# Patient Record
Sex: Female | Born: 1940 | Race: White | Hispanic: No | Smoking: Former smoker
Health system: Southern US, Community
[De-identification: ages and names within clinical notes are randomized; demographics above are authoritative.]

## PROBLEM LIST (undated history)

## (undated) DIAGNOSIS — E785 Hyperlipidemia, unspecified: Secondary | ICD-10-CM

## (undated) DIAGNOSIS — I10 Essential (primary) hypertension: Secondary | ICD-10-CM

## (undated) DIAGNOSIS — F0390 Unspecified dementia without behavioral disturbance: Secondary | ICD-10-CM

---

## 2009-03-06 ENCOUNTER — Ambulatory Visit: Payer: Self-pay | Admitting: Internal Medicine

## 2009-03-14 ENCOUNTER — Ambulatory Visit: Payer: Self-pay

## 2009-09-28 ENCOUNTER — Ambulatory Visit: Payer: Self-pay | Admitting: Internal Medicine

## 2010-12-04 ENCOUNTER — Ambulatory Visit: Payer: Self-pay | Admitting: Internal Medicine

## 2011-12-11 ENCOUNTER — Ambulatory Visit: Payer: Self-pay | Admitting: Internal Medicine

## 2012-01-10 ENCOUNTER — Encounter: Payer: Self-pay | Admitting: Nurse Practitioner

## 2012-01-10 ENCOUNTER — Encounter: Payer: Self-pay | Admitting: Cardiothoracic Surgery

## 2012-01-13 ENCOUNTER — Ambulatory Visit: Payer: Self-pay | Admitting: Internal Medicine

## 2012-01-29 ENCOUNTER — Encounter: Payer: Self-pay | Admitting: Cardiothoracic Surgery

## 2012-01-29 ENCOUNTER — Encounter: Payer: Self-pay | Admitting: Nurse Practitioner

## 2012-02-17 LAB — CBC
HGB: 13 g/dL (ref 12.0–16.0)
MCH: 29.7 pg (ref 26.0–34.0)
MCHC: 33.4 g/dL (ref 32.0–36.0)
MCV: 89 fL (ref 80–100)
Platelet: 185 10*3/uL (ref 150–440)
RDW: 13.1 % (ref 11.5–14.5)
WBC: 11.8 10*3/uL — ABNORMAL HIGH (ref 3.6–11.0)

## 2012-02-17 LAB — COMPREHENSIVE METABOLIC PANEL
Alkaline Phosphatase: 78 U/L (ref 50–136)
BUN: 15 mg/dL (ref 7–18)
Bilirubin,Total: 0.3 mg/dL (ref 0.2–1.0)
Calcium, Total: 8.6 mg/dL (ref 8.5–10.1)
Chloride: 105 mmol/L (ref 98–107)
Co2: 25 mmol/L (ref 21–32)
EGFR (African American): >60
EGFR (Non-African Amer.): >60
Osmolality: 283 (ref 275–301)
Potassium: 3.7 mmol/L (ref 3.5–5.1)
SGOT(AST): 32 U/L (ref 15–37)
Total Protein: 7 g/dL (ref 6.4–8.2)

## 2012-02-17 LAB — CK TOTAL AND CKMB (NOT AT ARMC)
CK, Total: 106 U/L (ref 21–215)
CK-MB: 0.8 ng/mL (ref 0.5–3.6)

## 2012-02-17 LAB — TROPONIN I: Troponin-I: 0.13 ng/mL — ABNORMAL HIGH

## 2012-02-18 ENCOUNTER — Inpatient Hospital Stay: Payer: Self-pay | Admitting: Internal Medicine

## 2012-02-18 LAB — URINALYSIS, COMPLETE
Bacteria: NONE SEEN
Bilirubin,UR: NEGATIVE
Blood: NEGATIVE
Glucose,UR: NEGATIVE mg/dL (ref 0–75)
Leukocyte Esterase: NEGATIVE
Ph: 5 (ref 4.5–8.0)
Protein: 100
Squamous Epithelial: <1

## 2012-02-19 LAB — CBC WITH DIFFERENTIAL/PLATELET
Basophil #: 0.1 10*3/uL (ref 0.0–0.1)
Eosinophil #: 0.2 10*3/uL (ref 0.0–0.7)
HGB: 11.6 g/dL — ABNORMAL LOW (ref 12.0–16.0)
Lymphocyte #: 2 10*3/uL (ref 1.0–3.6)
Lymphocyte %: 29.4 %
MCHC: 33.5 g/dL (ref 32.0–36.0)
MCV: 89 fL (ref 80–100)
Monocyte #: 0.7 x10 3/mm (ref 0.2–0.9)
Monocyte %: 10.3 %
Neutrophil #: 3.9 10*3/uL (ref 1.4–6.5)
Neutrophil %: 56.8 %
Platelet: 169 10*3/uL (ref 150–440)
RBC: 3.9 10*6/uL (ref 3.80–5.20)
RDW: 12.9 % (ref 11.5–14.5)

## 2012-02-19 LAB — BASIC METABOLIC PANEL
Anion Gap: 6 — ABNORMAL LOW (ref 7–16)
BUN: 15 mg/dL (ref 7–18)
Calcium, Total: 8.6 mg/dL (ref 8.5–10.1)
Chloride: 105 mmol/L (ref 98–107)
Co2: 31 mmol/L (ref 21–32)
Creatinine: 0.79 mg/dL (ref 0.60–1.30)
EGFR (African American): >60
Potassium: 4 mmol/L (ref 3.5–5.1)
Sodium: 142 mmol/L (ref 136–145)

## 2012-02-20 LAB — BASIC METABOLIC PANEL
Anion Gap: 10 (ref 7–16)
BUN: 10 mg/dL (ref 7–18)
Chloride: 105 mmol/L (ref 98–107)
Co2: 28 mmol/L (ref 21–32)
Creatinine: 0.66 mg/dL (ref 0.60–1.30)
EGFR (African American): >60
Glucose: 79 mg/dL (ref 65–99)
Osmolality: 283 (ref 275–301)
Potassium: 3.9 mmol/L (ref 3.5–5.1)
Sodium: 143 mmol/L (ref 136–145)

## 2012-02-20 LAB — CBC WITH DIFFERENTIAL/PLATELET
Basophil #: 0 10*3/uL (ref 0.0–0.1)
Eosinophil #: 0.2 10*3/uL (ref 0.0–0.7)
Eosinophil %: 3.3 %
HCT: 35.2 % (ref 35.0–47.0)
Lymphocyte %: 25.3 %
MCH: 29.8 pg (ref 26.0–34.0)
MCHC: 33.8 g/dL (ref 32.0–36.0)
MCV: 88 fL (ref 80–100)
Monocyte #: 0.7 x10 3/mm (ref 0.2–0.9)
Monocyte %: 9.3 %
Neutrophil #: 4.5 10*3/uL (ref 1.4–6.5)
Neutrophil %: 61.5 %
Platelet: 191 10*3/uL (ref 150–440)
RBC: 4 10*6/uL (ref 3.80–5.20)
RDW: 12.8 % (ref 11.5–14.5)
WBC: 7.3 10*3/uL (ref 3.6–11.0)

## 2012-02-29 ENCOUNTER — Encounter: Payer: Self-pay | Admitting: Nurse Practitioner

## 2012-02-29 ENCOUNTER — Encounter: Payer: Self-pay | Admitting: Cardiothoracic Surgery

## 2012-03-30 ENCOUNTER — Encounter: Payer: Self-pay | Admitting: Cardiothoracic Surgery

## 2012-03-30 ENCOUNTER — Encounter: Payer: Self-pay | Admitting: Nurse Practitioner

## 2012-04-30 ENCOUNTER — Encounter: Payer: Self-pay | Admitting: Nurse Practitioner

## 2012-04-30 ENCOUNTER — Encounter: Payer: Self-pay | Admitting: Cardiothoracic Surgery

## 2012-05-31 ENCOUNTER — Encounter: Payer: Self-pay | Admitting: Cardiothoracic Surgery

## 2012-05-31 ENCOUNTER — Encounter: Payer: Self-pay | Admitting: Nurse Practitioner

## 2012-06-30 ENCOUNTER — Encounter: Payer: Self-pay | Admitting: Cardiothoracic Surgery

## 2012-06-30 ENCOUNTER — Encounter: Payer: Self-pay | Admitting: Nurse Practitioner

## 2012-07-31 ENCOUNTER — Encounter: Payer: Self-pay | Admitting: Cardiothoracic Surgery

## 2012-07-31 ENCOUNTER — Encounter: Payer: Self-pay | Admitting: Nurse Practitioner

## 2012-08-30 ENCOUNTER — Encounter: Payer: Self-pay | Admitting: Nurse Practitioner

## 2012-08-30 ENCOUNTER — Encounter: Payer: Self-pay | Admitting: Cardiothoracic Surgery

## 2012-10-05 ENCOUNTER — Encounter: Payer: Self-pay | Admitting: Nurse Practitioner

## 2012-10-05 ENCOUNTER — Encounter: Payer: Self-pay | Admitting: Cardiothoracic Surgery

## 2012-10-31 ENCOUNTER — Encounter: Payer: Self-pay | Admitting: Nurse Practitioner

## 2012-10-31 ENCOUNTER — Encounter: Payer: Self-pay | Admitting: Cardiothoracic Surgery

## 2012-11-28 ENCOUNTER — Encounter: Payer: Self-pay | Admitting: Nurse Practitioner

## 2012-11-28 ENCOUNTER — Encounter: Payer: Self-pay | Admitting: Cardiothoracic Surgery

## 2012-12-29 ENCOUNTER — Encounter: Payer: Self-pay | Admitting: Cardiothoracic Surgery

## 2012-12-29 ENCOUNTER — Encounter: Payer: Self-pay | Admitting: Nurse Practitioner

## 2013-01-28 ENCOUNTER — Encounter: Payer: Self-pay | Admitting: Cardiothoracic Surgery

## 2013-01-28 ENCOUNTER — Encounter: Payer: Self-pay | Admitting: Nurse Practitioner

## 2013-02-28 ENCOUNTER — Encounter: Payer: Self-pay | Admitting: Nurse Practitioner

## 2013-02-28 ENCOUNTER — Encounter: Payer: Self-pay | Admitting: Cardiothoracic Surgery

## 2015-01-22 NOTE — H&P (Signed)
PATIENT NAME:  Karina [name], Karina [name] MR#:  [id number] DATE OF BIRTH:  [birth year]  DATE OF ADMISSION:  02/18/2012  PRIMARY CARE PHYSICIAN: Dr. Marcello Fennel  CHIEF COMPLAINT:  Syncope and finding of elevated troponin.   HISTORY OF PRESENT ILLNESS: Mrs. Madera is a 74 year old Caucasian female with history of dementia and depression. She is widowed. Lives at home, cared for by her brother. She is total care. She spends her time in a wheelchair, but with assistance she is able to stand up and hold her weight when her brother takes her to the bathroom. The brother reports that today while she was on the commode she rolled her eyes backward and then she lay on the floor. She did not talk and did not respond to him. He dialed 911. In the interim while he was on the telephone she was mumbling a few words. He thinks that she had only brief loss of consciousness. The patient was brought here to the hospital for evaluation and the findings here are showing some EKG changes and also elevated troponin. The patient was admitted for evaluation of syncope in association with possible non-ST elevation acute myocardial infarction.   REVIEW OF SYSTEMS: 10-point system review is not obtainable due to the patient's advanced dementia.   PAST MEDICAL HISTORY:  1. Dementia. 2. Depression. 3. Mild hypercholesterolemia.  4. The patient currently is being [facility] by [facility] for bedsores located in the posterior heel area on both feet.  FAMILY HISTORY: Her father died from metastatic kidney cancer. Her mother suffered several strokes before she died.   SOCIAL HISTORY: She is widowed. Her husband died a few years ago from complications of dementia. She lives at home with her brother. The patient is confined to a wheelchair.   SOCIAL HABITS: Nonsmoker. No history of alcohol abuse.   ADMISSION MEDICATIONS:  1. Donepezil or Aricept 10 mg a day.  2. Zyprexa 10 mg at night. 3. Mirtazapine 30 mg at night.  4. She is also on  pravastatin 10 mg, but the brother said he gives her this medication only once a week.   ALLERGIES: Penicillin causes skin rash.    PHYSICAL EXAMINATION:  VITAL SIGNS: Blood pressure 107/71, respiratory rate 16, pulse 97, temperature 96.5, oxygen saturation 96%.   GENERAL APPEARANCE: Elderly pleasant female lying in bed in no acute distress.   HEAD: No pallor. No icterus. No cyanosis. Hearing was normal. She will respond to the questions but inappropriate answers. Nasal mucosa, lips, and tongue were normal. Eye examination revealed normal eyelids and conjunctivae. Pupils about 5 mm, equal and reactive to light.   NECK: Supple. Trachea at midline. No thyromegaly. No cervical lymphadenopathy. No masses.   HEART: Normal S1, S2. No S3, S4. No murmur. No gallop. No carotid bruits.   RESPIRATORY: Normal breathing pattern without use of accessory muscles. No rales. No wheezing.   ABDOMEN: Soft without tenderness. No hepatosplenomegaly. No masses. No hernias.   SKIN: She has bilateral heel bedsores. No discharge. No skin rash.   MUSCULOSKELETAL: No joint swelling. No clubbing.   NEUROLOGIC: Cranial nerves II through XII were intact. No focal motor deficit but generally she is feeble and weak.   PSYCHIATRY: The patient smiles and communicates but answers are consistent with her dementia.   LABORATORY, DIAGNOSTIC, AND RADIOLOGICAL DATA:  CT scan of the head showed no evidence of intracranial hemorrhage or mass or acute abnormalities. Her chest x-ray showed heart size is upper normal or slightly enlarged. No effusion. No  consolidation. EKG showed sinus tachycardia at a rate of 104 per minute. Tendency towards low voltage. Incomplete right bundle branch block. These are new changes compared to an old EKG done in June 2010. Serum glucose 114, BUN 15, creatinine 0.7, sodium 141, potassium 3.7. Liver function tests were normal except for slightly low albumin at 3.1. CPK 106. Troponin is elevated at 0.13.  CBC showed white count of 11,800, hemoglobin 13, hematocrit 38, platelet count 185. Urinalysis: Cloudy urine, five RBCs, four white blood cells.   ASSESSMENT:  1. Syncope. 2. Non-ST elevation acute myocardial infarction.  3. Advanced dementia, bedridden and requires total care. 4. Possible underlying urinary tract infection. 5. Bilateral heel bedsores.   PLAN: We will admit the patient to telemetry, follow up on cardiac enzymes. Start aspirin once a day. Small dose of beta blocker using Coreg 3.125 mg twice a day. I will resume her statin, Pravachol using 20 mg a day. Consult cardiology. The patient is FULL CODE,  but I think her treatment should be conservative given her advanced dementia. I will leave this decision for the cardiologist of how far he wants to go. I did not order echocardiogram for similar reasons. I placed her on Lovenox. I will send urine for culture and sensitivity. Wound care consult and also use heel protectors. I spoke with her brother regarding the patient's LIVING WILL. He states that she does have a LIVING WILL. She is a FULL CODE and he has the power of attorney. Her brother is Ileana LaddWalter R. Petree.   TIME SPENT EVALUATING THIS PATIENT: More than 55 minutes.    ____________________________ Carney CornersAmir M. Rudene Rearwish, MD amd:bjt D: 02/18/2012 01:37:38 ET T: 02/18/2012 07:51:27 ET JOB#: [id number]  cc: Carney CornersAmir M. Rudene Rearwish, MD, <Dictator> Barbette ReichmannVishwanath Hande, MD Karolee OhsAMIR Dala DockM Daron Stutz MD ELECTRONICALLY SIGNED 02/19/2012 7:04

## 2015-01-22 NOTE — Discharge Summary (Signed)
PATIENT NAME:  Karina [name], Karina [name] MR#:  [id number] DATE OF BIRTH:  05-14-1941  DATE OF ADMISSION:  02/18/2012 DATE OF DISCHARGE:  02/20/2012  DIAGNOSES AT TIME OF DISCHARGE: 1. Syncope.  2. Elevated troponin.  3. Advanced dementia.  4. Bilateral heel bed sores.   CHIEF COMPLAINT: Syncope.   HISTORY OF PRESENT ILLNESS: Karina [name] is [name] 74 year old female with [name] history of dementia, depression, who lives at home cared for by her brother and according to brother she was on the commode when she rolled her eyes backwards and she laid on the floor. She did not respond to him for [name] few seconds and then subsequently was more awake and mumbled [name] few words. Reportedly patient had only [name] brief loss of consciousness. Patient was noted on arrival to have an elevated troponin. Was admitted for that reason.   PAST MEDICAL HISTORY:  1. Dementia. 2. Depression. 3. Mild hyperlipidemia.  4. She also has bed sores located in the he posterior aspect of the heel on both feet and is currently being seen in the wound care center.   CURRENT MEDICATIONS:  1. Donepezil.  2. Zyprexa. 3. Mirtazapine. 4. Pravastatin.   PHYSICAL EXAMINATION: VITAL SIGNS: Blood pressure 107/71, respirations 16, pulse 97, temperature 96.5, oxygen saturation 96. GENERAL: She is an elderly female, not in distress. HEENT: Normocephalic, atraumatic. NECK: Supple. HEART: S1, S2. LUNGS: Clear to auscultation. ABDOMEN: Soft, nontender. EXTREMITIES: No edema.   HOSPITAL COURSE: Patient was seen in consultation by cardiologist, Dr. Lady GaryFath, who felt that she had mild elevation of serum troponin of 0.64. She was [name] difficult historian. Electrocardiogram did not show evidence of ischemia and it was unclear whether she had true myocardial event and he did not feel that she was [name] candidate for further diagnostic work-up for ischemia considering her comorbid condition. Patient was initially admitted to Critical Care Unit and subsequently transferred to  telemetry floor. She was also seen by wound care consult and was noted to have [name] stage II heel pressure ulcer on the right side and [name] stage III distal heel pressure on the left side. Wounds were clean and dressing was done with calcium alginate applied to the wound base. The wound is covered with hydrocolloid dressings and was recommended dressing to be changed q.3-5 days.  Patient was stable during her stay in the hospital. CT head scan did not show any intracranial abnormalities. There were chronic findings. [name] carotid duplex ultrasound did not show evidence of hemodynamically significant stenosis. Chest x-ray did not show any acute abnormalities. Patient was discharged in stable condition on the following medications.   DISCHARGE MEDICATIONS:  1. Carvedilol 3.125 mg p.o. b.i.d.  2. Mirtazapine 30 mg p.o. at bedtime. 3. Aricept 10 mg p.o. daily.  4. Lovastatin 20 mg [name] day.  5. Aspirin 325 mg [name] day.  DISCHARGE INSTRUCTIONS: Has been advised nursing, physical and occupational therapy and wound care as per wound care team. Patient will be followed up with me, Dr. Marcello FennelHande, in 1 to 2 weeks' time.  ____________________________ Barbette ReichmannVishwanath [place] Willow, MD vh:cms D: 02/28/2012 17:07:20 ET T: 02/29/2012 12:43:29 ET JOB#: [id number]  cc: Barbette ReichmannVishwanath Ashur Glatfelter, MD, <Dictator> Barbette ReichmannVISHWANATH Kaydon Husby MD ELECTRONICALLY SIGNED 03/12/2012 13:00

## 2015-01-22 NOTE — Consult Note (Signed)
    General Aspect 74 year old female with depression, dementia who was admitted with chest pain and has a mild serum troponin elevation of 0.64.  She is a very difficult historian due to her underlying comorbid conditions.  Her electrocardiogram did not show significant ischemia.  She has improved and is hemodynamically stable at present.   Physical Exam:   GEN disheveled    HEENT PERRL    NECK supple  No masses    RESP no use of accessory muscles    CARD Regular rate and rhythm  Murmur    Murmur Systolic    Systolic Murmur axilla    ABD denies tenderness  normal BS    LYMPH negative neck    EXTR negative cyanosis/clubbing, negative edema    SKIN normal to palpation    NEURO motor/sensory function intact    PSYCH poor insight   Review of Systems:   Respiratory: Short of breath    Cardiovascular: Tightness    Review of Systems: All other systems were reviewed and found to be negative    ROS very limited historian     depression:    dementia:   EKG:   EKG NSR    Abnormal NSSTTW changes    Penicillin: Rash    Impression 74 year old female with depression and dementia who is admitted with shortness of breath and chest discomfort.  She has a mild serum: Elevation which may suggest a non-ST elevation myocardial infarction.  She is currently pain-free but is a very difficult historian.  She denies syncope or presyncope.  She denies any knowledge of prior cardiac history.  She apparently is total care do to her underlying comorbid conditions.  Echocardiogram is pending    Plan 1.  Continue with current medications 2.  Continue to rule out for myocardial infarction 3.  Await echocardiogram 4.  Does not appear to be a candidate for invasive cardiac evaluation or noninvasive ischemic workup given comorbid condition.  Would continue to treat medically and follow for further symptoms.    5.  Transfer to telemetry   Electronic Signatures: Dalia HeadingFath, Kenneth A (MD)  (Signed  21-May-13 14:38)  Authored: General Aspect/Present Illness, History and Physical Exam, Review of System, Past Medical History, EKG , Allergies, Impression/Plan   Last Updated: 21-May-13 14:38 by Dalia HeadingFath, Kenneth A (MD)

## 2015-09-13 ENCOUNTER — Observation Stay
Admission: EM | Admit: 2015-09-13 | Discharge: 2015-09-15 | Disposition: A | Discharge status: Home / Self Care | Payer: Medicare Other | Attending: Internal Medicine | Admitting: Internal Medicine

## 2015-09-13 ENCOUNTER — Encounter: Payer: Self-pay | Admitting: Emergency Medicine

## 2015-09-13 ENCOUNTER — Emergency Department: Payer: Medicare Other

## 2015-09-13 DIAGNOSIS — F0390 Unspecified dementia without behavioral disturbance: Secondary | ICD-10-CM | POA: Insufficient documentation

## 2015-09-13 DIAGNOSIS — Z880 Allergy status to penicillin: Secondary | ICD-10-CM | POA: Insufficient documentation

## 2015-09-13 DIAGNOSIS — R4182 Altered mental status, unspecified: Secondary | ICD-10-CM | POA: Diagnosis not present

## 2015-09-13 DIAGNOSIS — Z818 Family history of other mental and behavioral disorders: Secondary | ICD-10-CM | POA: Insufficient documentation

## 2015-09-13 DIAGNOSIS — L8990 Pressure ulcer of unspecified site, unspecified stage: Secondary | ICD-10-CM | POA: Insufficient documentation

## 2015-09-13 DIAGNOSIS — Z823 Family history of stroke: Secondary | ICD-10-CM | POA: Insufficient documentation

## 2015-09-13 DIAGNOSIS — L89301 Pressure ulcer of unspecified buttock, stage 1: Secondary | ICD-10-CM | POA: Diagnosis not present

## 2015-09-13 DIAGNOSIS — N390 Urinary tract infection, site not specified: Secondary | ICD-10-CM | POA: Insufficient documentation

## 2015-09-13 DIAGNOSIS — I119 Hypertensive heart disease without heart failure: Secondary | ICD-10-CM | POA: Insufficient documentation

## 2015-09-13 DIAGNOSIS — F809 Developmental disorder of speech and language, unspecified: Secondary | ICD-10-CM | POA: Diagnosis not present

## 2015-09-13 DIAGNOSIS — E785 Hyperlipidemia, unspecified: Secondary | ICD-10-CM | POA: Diagnosis not present

## 2015-09-13 DIAGNOSIS — Z87891 Personal history of nicotine dependence: Secondary | ICD-10-CM | POA: Insufficient documentation

## 2015-09-13 DIAGNOSIS — Z79899 Other long term (current) drug therapy: Secondary | ICD-10-CM | POA: Insufficient documentation

## 2015-09-13 DIAGNOSIS — Z8249 Family history of ischemic heart disease and other diseases of the circulatory system: Secondary | ICD-10-CM | POA: Insufficient documentation

## 2015-09-13 DIAGNOSIS — Z7401 Bed confinement status: Secondary | ICD-10-CM | POA: Insufficient documentation

## 2015-09-13 DIAGNOSIS — I517 Cardiomegaly: Secondary | ICD-10-CM | POA: Diagnosis not present

## 2015-09-13 HISTORY — DX: Hyperlipidemia, unspecified: E78.5

## 2015-09-13 HISTORY — DX: Essential (primary) hypertension: I10

## 2015-09-13 HISTORY — DX: Unspecified dementia, unspecified severity, without behavioral disturbance, psychotic disturbance, mood disturbance, and anxiety: F03.90

## 2015-09-13 LAB — COMPREHENSIVE METABOLIC PANEL
ALBUMIN: 3.3 g/dL — AB (ref 3.5–5.0)
ALK PHOS: 73 U/L (ref 38–126)
ALT: 21 U/L (ref 14–54)
ANION GAP: 6 (ref 5–15)
AST: 20 U/L (ref 15–41)
BILIRUBIN TOTAL: 0.9 mg/dL (ref 0.3–1.2)
BUN: 12 mg/dL (ref 6–20)
CO2: 25 mmol/L (ref 22–32)
Calcium: 9.1 mg/dL (ref 8.9–10.3)
Chloride: 111 mmol/L (ref 101–111)
Creatinine, Ser: 0.5 mg/dL (ref 0.44–1.00)
GFR calc Af Amer: >60 mL/min (ref >60)
GFR calc non Af Amer: >60 mL/min (ref >60)
GLUCOSE: 101 mg/dL — AB (ref 65–99)
Potassium: 3.8 mmol/L (ref 3.5–5.1)
Sodium: 142 mmol/L (ref 135–145)
TOTAL PROTEIN: 7 g/dL (ref 6.5–8.1)

## 2015-09-13 LAB — URINALYSIS COMPLETE WITH MICROSCOPIC (ARMC ONLY)
BILIRUBIN URINE: NEGATIVE
GLUCOSE, UA: NEGATIVE mg/dL
KETONES UR: NEGATIVE mg/dL
NITRITE: POSITIVE — AB
Protein, ur: NEGATIVE mg/dL
SPECIFIC GRAVITY, URINE: 1.014 (ref 1.005–1.030)
pH: 6 (ref 5.0–8.0)

## 2015-09-13 LAB — CBC
HCT: 41 % (ref 35.0–47.0)
Hemoglobin: 13.7 g/dL (ref 12.0–16.0)
MCH: 29.6 pg (ref 26.0–34.0)
MCHC: 33.4 g/dL (ref 32.0–36.0)
MCV: 88.7 fL (ref 80.0–100.0)
PLATELETS: 281 10*3/uL (ref 150–440)
RBC: 4.62 MIL/uL (ref 3.80–5.20)
RDW: 12.7 % (ref 11.5–14.5)
WBC: 11 10*3/uL (ref 3.6–11.0)

## 2015-09-13 LAB — CBC WITH DIFFERENTIAL/PLATELET
Basophils Absolute: 0.2 10*3/uL — ABNORMAL HIGH (ref 0–0.1)
Basophils Relative: 1 %
Eosinophils Absolute: 0.4 10*3/uL (ref 0–0.7)
Eosinophils Relative: 3 %
HEMATOCRIT: 43.9 % (ref 35.0–47.0)
HEMOGLOBIN: 14.6 g/dL (ref 12.0–16.0)
LYMPHS ABS: 2 10*3/uL (ref 1.0–3.6)
Lymphocytes Relative: 16 %
MCH: 29.8 pg (ref 26.0–34.0)
MCHC: 33.3 g/dL (ref 32.0–36.0)
MCV: 89.5 fL (ref 80.0–100.0)
MONOS PCT: 7 %
Monocytes Absolute: 0.8 10*3/uL (ref 0.2–0.9)
NEUTROS PCT: 73 %
Neutro Abs: 8.8 10*3/uL — ABNORMAL HIGH (ref 1.4–6.5)
Platelets: 269 10*3/uL (ref 150–440)
RBC: 4.9 MIL/uL (ref 3.80–5.20)
RDW: 13.1 % (ref 11.5–14.5)
WBC: 12.2 10*3/uL — AB (ref 3.6–11.0)

## 2015-09-13 LAB — CREATININE, SERUM: CREATININE: 0.48 mg/dL (ref 0.44–1.00)

## 2015-09-13 LAB — TROPONIN I

## 2015-09-13 MED ORDER — LEVOFLOXACIN IN D5W 750 MG/150ML IV SOLN
750.0000 mg | Freq: Once | INTRAVENOUS | Status: AC
  Administered 2015-09-13: 750 mg via INTRAVENOUS
  Filled 2015-09-13: qty 150

## 2015-09-13 MED ORDER — VITAMIN B-12 1000 MCG PO TABS
1000.0000 ug | ORAL_TABLET | Freq: Every day | ORAL | Status: DC
  Administered 2015-09-13 – 2015-09-15 (×3): 1000 ug via ORAL
  Filled 2015-09-13 (×3): qty 1

## 2015-09-13 MED ORDER — OLANZAPINE 10 MG PO TABS
10.0000 mg | ORAL_TABLET | Freq: Every day | ORAL | Status: DC
  Administered 2015-09-13 – 2015-09-14 (×2): 10 mg via ORAL
  Filled 2015-09-13 (×2): qty 1

## 2015-09-13 MED ORDER — SODIUM CHLORIDE 0.9 % IV SOLN
INTRAVENOUS | Status: DC
  Administered 2015-09-13 – 2015-09-14 (×2): via INTRAVENOUS

## 2015-09-13 MED ORDER — ONDANSETRON HCL 4 MG/2ML IJ SOLN: 4.0000 mg | Freq: Four times a day (QID) | INTRAMUSCULAR | Status: DC | PRN

## 2015-09-13 MED ORDER — SODIUM CHLORIDE 0.9 % IV BOLUS (SEPSIS)
500.0000 mL | Freq: Once | INTRAVENOUS | Status: AC
  Administered 2015-09-13: 500 mL via INTRAVENOUS

## 2015-09-13 MED ORDER — PRAVASTATIN SODIUM 20 MG PO TABS
20.0000 mg | ORAL_TABLET | Freq: Every day | ORAL | Status: DC
  Administered 2015-09-13 – 2015-09-14 (×2): 20 mg via ORAL
  Filled 2015-09-13 (×2): qty 1

## 2015-09-13 MED ORDER — ACETAMINOPHEN 650 MG RE SUPP: 650.0000 mg | Freq: Four times a day (QID) | RECTAL | Status: DC | PRN

## 2015-09-13 MED ORDER — ACETAMINOPHEN 325 MG PO TABS: 650.0000 mg | ORAL_TABLET | Freq: Four times a day (QID) | ORAL | Status: DC | PRN

## 2015-09-13 MED ORDER — ENOXAPARIN SODIUM 40 MG/0.4ML ~~LOC~~ SOLN
40.0000 mg | SUBCUTANEOUS | Status: DC
  Administered 2015-09-13 – 2015-09-14 (×2): 40 mg via SUBCUTANEOUS
  Filled 2015-09-13 (×2): qty 0.4

## 2015-09-13 MED ORDER — DONEPEZIL HCL 5 MG PO TABS
10.0000 mg | ORAL_TABLET | Freq: Every day | ORAL | Status: DC
  Administered 2015-09-13 – 2015-09-14 (×2): 10 mg via ORAL
  Filled 2015-09-13 (×2): qty 2

## 2015-09-13 MED ORDER — ONDANSETRON HCL 4 MG PO TABS: 4.0000 mg | ORAL_TABLET | Freq: Four times a day (QID) | ORAL | Status: DC | PRN

## 2015-09-13 MED ORDER — CARVEDILOL 3.125 MG PO TABS
3.1250 mg | ORAL_TABLET | Freq: Every day | ORAL | Status: DC
  Administered 2015-09-13 – 2015-09-15 (×3): 3.125 mg via ORAL
  Filled 2015-09-13 (×3): qty 1

## 2015-09-13 MED ORDER — LEVOFLOXACIN IN D5W 250 MG/50ML IV SOLN
250.0000 mg | INTRAVENOUS | Status: DC
  Filled 2015-09-13: qty 50

## 2015-09-13 NOTE — ED Provider Notes (Signed)
[facility] Emergency Department Provider Note  ____________________________________________  Time seen: Approximately 12:05 PM  I have reviewed the triage vital signs and the nursing notes.   HISTORY  Chief Complaint Altered Mental Status  Review of systems is limited due to the patient's dementia. All information is obtained from her brother/caregiver at bedside.  HPI Karina [name] is a 74 y.o. female dementia and hyperlipidemia who presents for evaluation of lethargy, increased confusion, inability to speak today, first noted when she was awakened at 10 AM, constant since onset, currently severe, no modifying factors. He reports that he awoke the patient at approximate 4:30 AM to rotate her in bed. When he went back into check on her at 10:30 AM and awakened her for breakfast, she was refusing to speak, was not following commands. She has had no fevers, chills, vomiting or diarrhea. No cough, no complaint of pain, no recent falls.   Past Medical History  Diagnosis Date  . Dementia   . Hypertension     Patient Active Problem List   Diagnosis Date Noted  . Altered mental status 09/13/2015    History reviewed. No pertinent past surgical history.  Current Outpatient Rx  Name  Route  Sig  Dispense  Refill  . carvedilol (COREG) 3.125 MG tablet   Oral   Take 3.125 mg by mouth daily.         Marland Kitchen. donepezil (ARICEPT) 10 MG tablet   Oral   Take 10 mg by mouth at bedtime.         . lovastatin (MEVACOR) 20 MG tablet   Oral   Take 20 mg by mouth at bedtime.         Marland Kitchen. OLANZapine (ZYPREXA) 10 MG tablet   Oral   Take 10 mg by mouth at bedtime.         . vitamin B-12 (CYANOCOBALAMIN) 1000 MCG tablet   Oral   Take 1,000 mcg by mouth daily.           Allergies Penicillins  Family History  Problem Relation Age of Onset  . Stroke Mother   . Coronary artery disease Father   . Depression Other     Social History Social History  Substance  Use Topics  . Smoking status: Former Games developermoker  . Smokeless tobacco: None  . Alcohol Use: No    Review of Systems Constitutional: No fever/chills Respiratory: No perceived shortness of breath. Gastrointestinal:  No vomiting.  No diarrhea.  No constipation. Genitourinary: Negative for hematuria or foul-smelling urine.   Review of systems is limited due to the patient's dementia. All information is obtained from her brother/caregiver at bedside. ____________________________________________   PHYSICAL EXAM:  VITAL SIGNS: ED Triage Vitals  Enc Vitals Group     BP 09/13/15 1200 157/74 mmHg     Pulse Rate 09/13/15 1200 85     Resp --      Temp 09/13/15 1200 99.4 F (37.4 C)     Temp Source 09/13/15 1200 Rectal     SpO2 09/13/15 1200 98 %     Weight 09/13/15 1200 153 lb 7 oz (69.6 kg)     Height 09/13/15 1200 5\' 6"  (1.676 m)     Head Cir --      Peak Flow --      Pain Score --      Pain Loc --      Pain Edu? --      Excl. in GC? --  Constitutional: Alert but is not oriented to self, place or year. She mutters incoherent phrases, does not follow commands. Eyes: Conjunctivae are normal. PERRL. EOMI. Head: Atraumatic. Nose: No congestion/rhinnorhea. Mouth/Throat: Mucous membranes are dry.  Oropharynx non-erythematous. Neck: No stridor.  Ears supple without meningismus. Cardiovascular: Normal rate, regular rhythm. Grossly normal heart sounds.  Good peripheral circulation. Respiratory: Normal respiratory effort.  No retractions. Lungs CTAB. Gastrointestinal: Soft and nontender. No distention. No CVA tenderness. Genitourinary: deferred Musculoskeletal: No lower extremity tenderness nor edema.  No joint effusions. Neurologic:  Patient with limited verbal interaction. She does not follow commands. She appears to move all extremities spontaneously and equally but does not cooperate with formal neurological testing. Skin:  Skin is warm, dry and intact. No rash  noted.   ____________________________________________   LABS (all labs ordered are listed, but only abnormal results are displayed)  Labs Reviewed  CBC WITH DIFFERENTIAL/PLATELET - Abnormal; Notable for the following:    WBC 12.2 (*)    Neutro Abs 8.8 (*)    Basophils Absolute 0.2 (*)    All other components within normal limits  COMPREHENSIVE METABOLIC PANEL - Abnormal; Notable for the following:    Glucose, Bld 101 (*)    Albumin 3.3 (*)    All other components within normal limits  URINALYSIS COMPLETEWITH MICROSCOPIC ([facility] ONLY) - Abnormal; Notable for the following:    Color, Urine YELLOW (*)    APPearance HAZY (*)    Hgb urine dipstick 2+ (*)    Nitrite POSITIVE (*)    Leukocytes, UA 2+ (*)    Bacteria, UA FEW (*)    Squamous Epithelial / LPF 0-5 (*)    All other components within normal limits  CULTURE, BLOOD (ROUTINE X 2)  CULTURE, BLOOD (ROUTINE X 2)  URINE CULTURE  TROPONIN I   ____________________________________________  EKG  ED ECG REPORT I, Gayla Doss, the attending physician, personally viewed and interpreted this ECG.   Date: 09/13/2015  EKG Time: 12:19  Rate: 83  Rhythm: normal sinus rhythm  Axis: normal  Intervals:none  ST&T Change: No acute ST elevation.  ____________________________________________  RADIOLOGY  CXR IMPRESSION: Cardiomegaly and bronchitic change without acute cardiopulmonary disease.  CT head  IMPRESSION: Mild diffuse cortical atrophy. Mild chronic ischemic white matter disease. No acute intracranial abnormality seen. ____________________________________________   PROCEDURES  Procedure(s) performed: None  Critical Care performed: No  ____________________________________________   INITIAL IMPRESSION / ASSESSMENT AND PLAN / ED COURSE  Pertinent labs & imaging results that were available during my care of the patient were reviewed by me and considered in my medical decision making (see chart for  details).  Karina [name] is a 74 y.o. female dementia and hyperlipidemia who presents for evaluation of lethargy, increased confusion, inability to speak today. On exam, she is awake but she is not oriented, does not follow commands but she does seem to move her extremities equally. Labs reviewed. Normal CMP. CBC notable for leukocytosis. Negative troponin. Urinalysis is concerning for urinary tract infection which I suspect is the cause of her change in mental status today. We'll give IV fluids, Levaquin given penicillin allergy with reaction unknown. CT head and chest x-ray negative. Case discussed with hospitalist, Dr. Cherlynn Kaiser, for admission at 2:30 PM. ____________________________________________   FINAL CLINICAL IMPRESSION(S) / ED DIAGNOSES  Final diagnoses:  Altered mental status, unspecified altered mental status type  UTI (lower urinary tract infection)      Gayla Doss, MD 09/13/15 1500

## 2015-09-13 NOTE — ED Notes (Signed)
Ems from home for change in mental status. Pt has dementia but has not been same as usual. Ems noted a strong urine smell at home.

## 2015-09-13 NOTE — Progress Notes (Signed)
ANTIBIOTIC CONSULT NOTE - INITIAL  Pharmacy Consult for Levaquin Indication: UTI  Allergies  Allergen Reactions  . Penicillins Other (See Comments)    Reaction:  Unknown     Patient Measurements: Height: 5\' 6"  (167.6 cm) Weight: 153 lb 7 oz (69.6 kg) IBW/kg (Calculated) : 59.3 Adjusted Body Weight:   Vital Signs: Temp: 99.4 F (37.4 C) (12/14 1200) Temp Source: Rectal (12/14 1200) BP: 146/95 mmHg (12/14 1545) Pulse Rate: 71 (12/14 1545) Intake/Output from previous day:   Intake/Output from this shift:    Labs:  Recent Labs  09/13/15 1214  WBC 12.2*  HGB 14.6  PLT 269  CREATININE 0.50   Estimated Creatinine Clearance: 56 mL/min (by C-G formula based on Cr of 0.5).   Microbiology: No results found for this or any previous visit (from the past 720 hour(s)).  Medical History: Past Medical History  Diagnosis Date  . Dementia   . Hypertension   . Hyperlipidemia     Medications:  Scheduled:   Anti-infectives    Start     Dose/Rate Route Frequency Ordered Stop   09/13/15 1430  levofloxacin (LEVAQUIN) IVPB 750 mg     750 mg 100 mL/hr over 90 Minutes Intravenous  Once 09/13/15 1419       Assessment: 74 yo female with UTI (UA 2+ LE, + nitrite, 6-30 WBC). Per Brother, patient has been coughing with meals and swallowing. CXR negative. SLP eval ordered Hx dementia. PCN allergy.  Plan:  Patient received Levaquin 750mg  IV x 1 in ER. Will continue with Levaquin 250mg  IV q24h for UTI dosing.  Bari MantisKristin Faustina Gebert PharmD Clinical Pharmacist 09/13/2015 4:54 PM

## 2015-09-13 NOTE — H&P (Signed)
[facility] Physicians - [place] at [facility]   PATIENT NAME: Karina [name]    MR#:  [id number]  DATE OF BIRTH:  01/16/1941  DATE OF ADMISSION:  09/13/2015  PRIMARY CARE PHYSICIAN: Barbette Reichmann, MD   REQUESTING/REFERRING PHYSICIAN: Dr. Chari Manning  CHIEF COMPLAINT:   Chief Complaint  Patient presents with  . Altered Mental Status    HISTORY OF PRESENT ILLNESS:  Karina [name]  is a 74 y.o. female with a known history of dementia, hypertension, hyperlipidemia who presents to the hospital brought in by her brother due to altered mental status. Herself has baseline dementia and therefore most of the history obtained from the brother at bedside. As per the brother he says that the patient was more lethargic and noncommunicative today. She is normally bed bound and he helps turn her every few hours and usually she is arousable but today she was unresponsive. He also noted that her body felt a bit warm and therefore he called EMS this morning with brought her to the emergency room. Patient was noted to have urinary tract infection and also noted to have a mild leukocytosis. Hospitalist services were contacted further treatment and evaluation.  PAST MEDICAL HISTORY:   Past Medical History  Diagnosis Date  . Dementia   . Hypertension   . Hyperlipidemia     PAST SURGICAL HISTORY:  History reviewed. No pertinent past surgical history.  SOCIAL HISTORY:   Social History  Substance Use Topics  . Smoking status: Former Games developer  . Smokeless tobacco: Not on file  . Alcohol Use: No    FAMILY HISTORY:   Family History  Problem Relation Age of Onset  . Stroke Mother   . Coronary artery disease Father   . Depression Other     DRUG ALLERGIES:   Allergies  Allergen Reactions  . Penicillins Other (See Comments)    Reaction:  Unknown     REVIEW OF SYSTEMS:   Review of Systems  Unable to perform ROS: dementia    MEDICATIONS AT HOME:   Prior to Admission  medications   Medication Sig Start Date End Date Taking? Authorizing Provider  carvedilol (COREG) 3.125 MG tablet Take 3.125 mg by mouth daily.   Yes Historical Provider, MD  donepezil (ARICEPT) 10 MG tablet Take 10 mg by mouth at bedtime.   Yes Historical Provider, MD  lovastatin (MEVACOR) 20 MG tablet Take 20 mg by mouth at bedtime.   Yes Historical Provider, MD  OLANZapine (ZYPREXA) 10 MG tablet Take 10 mg by mouth at bedtime.   Yes Historical Provider, MD  vitamin B-12 (CYANOCOBALAMIN) 1000 MCG tablet Take 1,000 mcg by mouth daily.   Yes Historical Provider, MD      VITAL SIGNS:  Blood pressure 157/74, pulse 85, temperature 99.4 F (37.4 C), temperature source Rectal, height  (1.676 m), weight 69.6 kg (153 lb 7 oz), SpO2 98 %.  PHYSICAL EXAMINATION:  Physical Exam  GENERAL:  74 y.o.-year-old patient lying in the bed with no acute distress.  EYES: Pupils equal, round, reactive to light and accommodation. No scleral icterus. Extraocular muscles intact.  HEENT: Head atraumatic, normocephalic. Oropharynx and nasopharynx clear. No oropharyngeal erythema, dry oral mucosa  NECK:  Supple, no jugular venous distention. No thyroid enlargement, no tenderness.  LUNGS: Normal breath sounds bilaterally, no wheezing, rales, rhonchi. No use of accessory muscles of respiration.  CARDIOVASCULAR: S1, S2 RRR. No murmurs, rubs, gallops, clicks.  ABDOMEN: Soft, nontender, nondistended. Bowel sounds present. No organomegaly or mass.  EXTREMITIES: No pedal edema, cyanosis, or clubbing. + 2 pedal & radial pulses b/l.   NEUROLOGIC: Cranial nerves II through XII are intact. No focal Motor or sensory deficits appreciated b/l.  Globally weak.   PSYCHIATRIC: The patient is alert and oriented x 1. Poor affect.  SKIN: No obvious rash, lesion, or ulcer.   LABORATORY PANEL:   CBC  Recent Labs Lab 09/13/15 1214  WBC 12.2*  HGB 14.6  HCT 43.9  PLT 269    ------------------------------------------------------------------------------------------------------------------  Chemistries   Recent Labs Lab 09/13/15 1214  NA 142  K 3.8  CL 111  CO2 25  GLUCOSE 101*  BUN 12  CREATININE 0.50  CALCIUM 9.1  AST 20  ALT 21  ALKPHOS 73  BILITOT 0.9   ------------------------------------------------------------------------------------------------------------------  Cardiac Enzymes  Recent Labs Lab 09/13/15 1214  TROPONINI <0.03   ------------------------------------------------------------------------------------------------------------------  RADIOLOGY:  Dg Chest 2 View  09/13/2015  CLINICAL DATA:  Altered mental status.  Former smoker. EXAM: CHEST  2 VIEW COMPARISON:  02/17/2012 FINDINGS: Grossly unchanged enlarged cardiac silhouette and mediastinal contours. Evaluation the retrosternal clear space obscured secondary to overlying soft tissues. Done opacities overlying the bilateral lower lungs are favored to represent overlying breast tissues. No discrete focal airspace opacities. There is grossly unchanged mild diffuse slightly nodular thickening of the pulmonary interstitium. No discrete focal airspace opacities. No pleural effusion or pneumothorax. No evidence of edema. No acute osseus abnormalities. IMPRESSION: Cardiomegaly and bronchitic change without acute cardiopulmonary disease. Electronically Signed   By: Simonne ComeJohn  Watts M.D.   On: 09/13/2015 13:05   Ct Head Wo Contrast  09/13/2015  CLINICAL DATA:  Altered mental status. EXAM: CT HEAD WITHOUT CONTRAST TECHNIQUE: Contiguous axial images were obtained from the base of the skull through the vertex without intravenous contrast. COMPARISON:  CT scan of Feb 17, 2012. FINDINGS: Bony calvarium appears intact. Mild diffuse cortical atrophy is noted. Mild chronic ischemic white matter disease is noted. No mass effect or midline shift is noted. Ventricular size is within normal limits. There is  no evidence of mass lesion, hemorrhage or acute infarction. IMPRESSION: Mild diffuse cortical atrophy. Mild chronic ischemic white matter disease. No acute intracranial abnormality seen. Electronically Signed   By: Lupita RaiderJames  Green Jr, M.D.   On: 09/13/2015 13:13     IMPRESSION AND PLAN:   74 year old female with past medical history of dementia, hypertension, hyperlipidemia who presented to the hospital due to altered mental status and noted to have a urinary tract infection.  #1 altered mental status-this is likely metabolic encephalopathy from the UTI. Patient CT head on admission is negative for any acute pathology. -I will treat her with IV fluids, IV antibiotics and follow her mental status.  #2 urinary tract infection-treated with IV Levaquin and follow urine culture.  #3 dysphagia-as per the patient's brother she has been coughing with meals and also when she swallows. -I will place her on a dysphagia 2 diet. I will get a speech evaluation. -Her chest x-ray presently is negative for any acute aspiration pneumonitis.  #4 hypertension-continue Coreg.  #5 dementia-continue Aricept.  #6 hyperlipidemia-continue Pravachol.   All the records are reviewed and case discussed with ED provider. Management plans discussed with the patient, family and they are in agreement.  CODE STATUS: Full  TOTAL TIME TAKING CARE OF THIS PATIENT: 45 minutes.    Houston SirenSAINANI,VIVEK J M.D on 09/13/2015 at 3:03 PM  Between 7am to 6pm - Pager - [phone]  After 6pm go to [email or web address] - password [email or web address]  [facility]  [place] [facility]  Office  [phone]  CC: Primary care physician; Barbette Reichmann, MD

## 2015-09-14 DIAGNOSIS — L8990 Pressure ulcer of unspecified site, unspecified stage: Secondary | ICD-10-CM | POA: Insufficient documentation

## 2015-09-14 LAB — BASIC METABOLIC PANEL
ANION GAP: 4 — AB (ref 5–15)
BUN: 13 mg/dL (ref 6–20)
CALCIUM: 8.4 mg/dL — AB (ref 8.9–10.3)
CO2: 23 mmol/L (ref 22–32)
Chloride: 110 mmol/L (ref 101–111)
Creatinine, Ser: 0.45 mg/dL (ref 0.44–1.00)
Glucose, Bld: 98 mg/dL (ref 65–99)
Potassium: 3.8 mmol/L (ref 3.5–5.1)
SODIUM: 137 mmol/L (ref 135–145)

## 2015-09-14 LAB — CBC
HEMATOCRIT: 39.2 % (ref 35.0–47.0)
HEMOGLOBIN: 13.3 g/dL (ref 12.0–16.0)
MCH: 30.6 pg (ref 26.0–34.0)
MCHC: 33.9 g/dL (ref 32.0–36.0)
MCV: 90.5 fL (ref 80.0–100.0)
Platelets: 242 10*3/uL (ref 150–440)
RBC: 4.33 MIL/uL (ref 3.80–5.20)
RDW: 12.9 % (ref 11.5–14.5)
WBC: 8.6 10*3/uL (ref 3.6–11.0)

## 2015-09-14 MED ORDER — LEVOFLOXACIN 250 MG PO TABS
250.0000 mg | ORAL_TABLET | Freq: Every day | ORAL | Status: DC
  Administered 2015-09-14 – 2015-09-15 (×2): 250 mg via ORAL
  Filled 2015-09-14 (×2): qty 1

## 2015-09-14 MED ORDER — ENSURE ENLIVE PO LIQD
237.0000 mL | Freq: Three times a day (TID) | ORAL | Status: DC
  Administered 2015-09-14 – 2015-09-15 (×5): 237 mL via ORAL

## 2015-09-14 NOTE — Plan of Care (Signed)
Problem: Education: Goal: Knowledge of [facility] Education information/materials will improve Outcome: Progressing     Pt lives at home with brother. Pt will speak a few words     Past Medical History   Diagnosis  Date   .  Dementia     .  Hypertension     .  Hyperlipidemia            Pt is well controlled with home medications                                                     Problem: Skin Integrity: Goal: Risk for impaired skin integrity will decrease Outcome: Progressing Pt alert to self. Will speak a few words. No c/o pain nor distress noted. Incontinent for urine. Continue to monitor

## 2015-09-14 NOTE — Evaluation (Signed)
Clinical/Bedside Swallow Evaluation Patient Details  Name: Karina [name] MRN: [id number] Date of Birth: 20-Aug-1941  Today's Date: 09/14/2015 Time: SLP Start Time (ACUTE ONLY): 1000 SLP Stop Time (ACUTE ONLY): 1100 SLP Time Calculation (min) (ACUTE ONLY): 60 min  Past Medical History:  Past Medical History  Diagnosis Date  . Dementia   . Hypertension   . Hyperlipidemia    Past Surgical History: History reviewed. No pertinent past surgical history. HPI:  Pt is a 74 y.o. female with a known history of dementia, hypertension, hyperlipidemia who presents to the hospital brought in by her brother due to altered mental status. Herself has baseline dementia and therefore most of the history obtained from the brother at bedside. As per the brother he says that the patient was more lethargic and noncommunicative today. She is normally bed bound and he helps turn her every few hours and usually she is arousable but today she was unresponsive. He also noted that her body felt a bit warm and therefore he called EMS this morning with brought her to the emergency room. Patient was noted to have urinary tract infection and also noted to have a mild leukocytosis. Pt has severe confusion/ Cognitive decline and verbalizes intermittently; unable to follow commands but can be directed to take po's w/ moderate encouragement and visual cues. Pt's brother later stated that pt did have some degree of dysphagia "a few years ago" but that she had been eating a soft foods diet at home drinking thin liquids. He stated he was "careful" during meals w/ her.    Assessment / Plan / Recommendation Clinical Impression  Pt appears at increased risk for aspiration at this time sec. to declined Cognitive status/ Dementia. When following general aspiration precautions, pt exhibited no overt s/s of aspiration w/ po trials during the eval; clear vocal quality when she spoke intermittently; no decline in respiratory status noted.  During the oral phase, deficits c/b decreased attention to task and min. increased oral phase time for bolus managment/transfer was noted. Given time, pt swallowed and cleared all bolus trials. Pt required feeding. Pt appears at min. increased risk for aspiration but may be at her baseline following general aspiration precautions, per brother's description. Rec. a Dys. 2 diet w/ thin liquids; aspiration precautions; meds in Puree - crushed as able. Feeding assistance at meals. NSG/brother updated. Pt would benefit from a Dietician consult for appropriate nutritional support.     Aspiration Risk  Mild aspiration risk    Diet Recommendation  Dys. 2 w/ thin liquids; aspiration precautions; feeding assistance at all meals. Nutritional supplements as ordered.   Medication Administration: Crushed with puree    Other  Recommendations Recommended Consults:  (Dietician) Oral Care Recommendations: Oral care BID;Staff/trained caregiver to provide oral care   Follow up Recommendations   (TBD)    Frequency and Duration min 2x/week  1 week       Prognosis Prognosis for Safe Diet Advancement: Fair Barriers to Reach Goals: Cognitive deficits      Swallow Study   General Date of Onset: 09/13/15 HPI: Pt is a 74 y.o. female with a known history of dementia, hypertension, hyperlipidemia who presents to the hospital brought in by her brother due to altered mental status. Herself has baseline dementia and therefore most of the history obtained from the brother at bedside. As per the brother he says that the patient was more lethargic and noncommunicative today. She is normally bed bound and he helps turn her every few hours  and usually she is arousable but today she was unresponsive. He also noted that her body felt a bit warm and therefore he called EMS this morning with brought her to the emergency room. Patient was noted to have urinary tract infection and also noted to have a mild leukocytosis. Pt has severe  confusion/ Cognitive decline and verbalizes intermittently; unable to follow commands but can be directed to take po's w/ moderate encouragement and visual cues. Pt's brother later stated that pt did have some degree of dysphagia "a few years ago" but that she had been eating a soft foods diet at home drinking thin liquids. He stated he was "careful" during meals w/ her.  Type of Study: Bedside Swallow Evaluation Previous Swallow Assessment: "years ago" Diet Prior to this Study: Thin liquids;Dysphagia 3 (soft) (soft foods) Temperature Spikes Noted: No (wbc 8.6) Respiratory Status: Room air History of Recent Intubation: No Behavior/Cognition: Confused;Distractible;Requires cueing;Doesn't follow directions;Pleasant mood (awake) Oral Cavity Assessment:  (difficult to fully assess sec. to pt's Cognitive status) Oral Care Completed by SLP: Yes Oral Cavity - Dentition: Adequate natural dentition Vision:  (n/a) Self-Feeding Abilities: Total assist Patient Positioning: Upright in bed Baseline Vocal Quality: Low vocal intensity (only spoke 5-6 words during eval) Volitional Cough: Cognitively unable to elicit Volitional Swallow: Unable to elicit    Oral/Motor/Sensory Function Overall Oral Motor/Sensory Function:  (unable to fully assess sec. to pt's declined Cognition)   Ice Chips Ice chips: Impaired Presentation: Spoon (fed; 3 trials) Oral Phase Impairments: Poor awareness of bolus (decreased attention to task) Oral Phase Functional Implications:  (increased oral phase time) Pharyngeal Phase Impairments:  (none)   Thin Liquid Thin Liquid: Impaired Presentation: Straw (fed - hand over hand w/ pt assisting holding cup) Oral Phase Impairments: Poor awareness of bolus (decreased attention to task) Oral Phase Functional Implications:  (increased oral phase time) Pharyngeal  Phase Impairments:  (none) Other Comments: suspect increased risk if pt is distracted during intake    Nectar Thick Nectar  Thick Liquid: Not tested   Honey Thick Honey Thick Liquid: Not tested   Puree Puree: Impaired Presentation: Spoon (fed; 6 trials) Oral Phase Impairments: Poor awareness of bolus (decreased attention to task) Oral Phase Functional Implications:  (increased oral phase time) Pharyngeal Phase Impairments:  (none)   Solid Solid: Impaired Presentation: Spoon (1 trial) Oral Phase Impairments: Impaired mastication;Poor awareness of bolus (decreased attention to task) Oral Phase Functional Implications:  (increased oral phase time) Pharyngeal Phase Impairments:  (none)      Jerilynn SomKatherine Teckla Christiansen, MS, CCC-SLP  Khamiyah Grefe 09/14/2015,4:33 PM

## 2015-09-14 NOTE — Progress Notes (Signed)
[facility] - [place] at [facility]   PATIENT NAME: Karina CrockerMargaret [name]    MR#:  [id number]  DATE OF BIRTH:  01/22/1941  SUBJECTIVE:  CHIEF COMPLAINT:   Chief Complaint  Patient presents with  . Altered Mental Status    Talked to her brother in room, she always had dementia, and did not talk much, bed bound for 3 years, and total care.  REVIEW OF SYSTEMS:   Dementia, not able to give me any history.  ROS  DRUG ALLERGIES:   Allergies  Allergen Reactions  . Penicillins Other (See Comments)    Reaction:  Unknown     VITALS:  Blood pressure 145/57, pulse 92, temperature 98.2 F (36.8 C), temperature source Oral, resp. rate 18, height 5\' 6"  (1.676 m), weight 69.542 kg (153 lb 5 oz), SpO2 95 %.  PHYSICAL EXAMINATION:  GENERAL:  74 y.o.-year-old patient lying in the bed with no acute distress.  EYES: Pupils equal, round, reactive to light and accommodation. No scleral icterus. Extraocular muscles intact.  HEENT: Head atraumatic, normocephalic. Oropharynx and nasopharynx clear.  NECK:  Supple, no jugular venous distention. No thyroid enlargement, no tenderness.  LUNGS: Normal breath sounds bilaterally, no wheezing, rales,rhonchi or crepitation. No use of accessory muscles of respiration.  CARDIOVASCULAR: S1, S2 normal. No murmurs, rubs, or gallops.  ABDOMEN: Soft, nontender, nondistended. Bowel sounds present. No organomegaly or mass.  EXTREMITIES: No pedal edema, cyanosis, or clubbing.  NEUROLOGIC: Cranial nerves II through XII are intact. Muscle strength 4/5 in upper limbs, lower limbs 0/5, and atrophic changes on foot muscles. PSYCHIATRIC: The patient is alert but not oriented, not talking much.  SKIN: sacral decubitus ulcer.   Physical Exam LABORATORY PANEL:   CBC  Recent Labs Lab 09/14/15 0452  WBC 8.6  HGB 13.3  HCT 39.2  PLT 242    ------------------------------------------------------------------------------------------------------------------  Chemistries   Recent Labs Lab 09/13/15 1214  09/14/15 0452  NA 142  --  137  K 3.8  --  3.8  CL 111  --  110  CO2 25  --  23  GLUCOSE 101*  --  98  BUN 12  --  13  CREATININE 0.50  < > 0.45  CALCIUM 9.1  --  8.4*  AST 20  --   --   ALT 21  --   --   ALKPHOS 73  --   --   BILITOT 0.9  --   --   < > = values in this interval not displayed. ------------------------------------------------------------------------------------------------------------------  Cardiac Enzymes  Recent Labs Lab 09/13/15 1214  TROPONINI <0.03   ------------------------------------------------------------------------------------------------------------------  RADIOLOGY:  Dg Chest 2 View  09/13/2015  CLINICAL DATA:  Altered mental status.  Former smoker. EXAM: CHEST  2 VIEW COMPARISON:  02/17/2012 FINDINGS: Grossly unchanged enlarged cardiac silhouette and mediastinal contours. Evaluation the retrosternal clear space obscured secondary to overlying soft tissues. Done opacities overlying the bilateral lower lungs are favored to represent overlying breast tissues. No discrete focal airspace opacities. There is grossly unchanged mild diffuse slightly nodular thickening of the pulmonary interstitium. No discrete focal airspace opacities. No pleural effusion or pneumothorax. No evidence of edema. No acute osseus abnormalities. IMPRESSION: Cardiomegaly and bronchitic change without acute cardiopulmonary disease. Electronically Signed   By: Simonne ComeJohn  Watts M.D.   On: 09/13/2015 13:05   Ct Head Wo Contrast  09/13/2015  CLINICAL DATA:  Altered mental status. EXAM: CT HEAD WITHOUT CONTRAST TECHNIQUE: Contiguous axial images were obtained from the base of the  skull through the vertex without intravenous contrast. COMPARISON:  CT scan of Feb 17, 2012. FINDINGS: Bony calvarium appears intact. Mild diffuse  cortical atrophy is noted. Mild chronic ischemic white matter disease is noted. No mass effect or midline shift is noted. Ventricular size is within normal limits. There is no evidence of mass lesion, hemorrhage or acute infarction. IMPRESSION: Mild diffuse cortical atrophy. Mild chronic ischemic white matter disease. No acute intracranial abnormality seen. Electronically Signed   By: Lupita Raider, M.D.   On: 09/13/2015 13:13    ASSESSMENT AND PLAN:   Active Problems:   Altered mental status   Pressure ulcer  #1 altered mental status-this is likely metabolic encephalopathy from the UTI. Patient CT head on admission is negative for any acute pathology. - IV fluids, IV antibiotics and follow her mental status.  #2 urinary tract infection-treated with IV Levaquin and follow urine culture.   Have proteus- pending sensitivity.  #3 dysphagia-as per the patient's brother she has been coughing with meals and also when she swallows. - SLP evaluation. Dysphagia diet. -Her chest x-ray presently is negative for any acute aspiration pneumonitis.  #4 hypertension-continue Coreg.  #5 dementia-continue Aricept.  #6 hyperlipidemia-continue Pravachol.   All the records are reviewed and case discussed with Care Management/Social Workerr. Management plans discussed with the patient, family and they are in agreement.  CODE STATUS: Full  TOTAL TIME TAKING CARE OF THIS PATIENT: 30 minutes.     POSSIBLE D/C IN 1-2 DAYS, DEPENDING ON CLINICAL CONDITION.   Altamese Dilling M.D on 09/14/2015   Between 7am to 6pm - Pager - [phone]  After 6pm go to [email or web address] - password [email or web address]  [facility]  Office  [phone]  CC: Primary care physician; Barbette Reichmann, MD  Note: This dictation was prepared with Dragon dictation along with smaller phrase technology. Any transcriptional errors that result from this process are unintentional.

## 2015-09-14 NOTE — Care Management Obs Status (Signed)
MEDICARE OBSERVATION STATUS NOTIFICATION   Patient Details  Name: Karina [name] MRN: [id number] Date of Birth: 08/07/1941   Medicare Observation Status Notification Given:  Yes    Gwenette GreetBrenda S Tulip Meharg, RN 09/14/2015, 11:50 AM

## 2015-09-14 NOTE — Plan of Care (Signed)
Problem: Education: Goal: Knowledge of [facility] Education information/materials will improve Outcome: Progressing Has received General Education Handout. Oriented to Oncology unit. Instructed and Demonstrated how to use phone to call RN and CNA for Assistance. Unable to demonstrate.  Problem: Safety: Goal: Ability to remain free from injury will improve Outcome: Progressing Instructed and Demonstrated how to use phone to call RN and CNA for Assistance. Unable to demonstrate. Hourly Rounding. Remained free of injury of fall this shift.  Problem: Skin Integrity: Goal: Risk for impaired skin integrity will decrease Outcome: Progressing Skin Pale and Dry. Stage 1 Left Buttocks. Pink Foam dressing intact.

## 2015-09-14 NOTE — Progress Notes (Signed)
PHARMACIST - PHYSICIAN COMMUNICATION DR:   Elisabeth PigeonVachhani CONCERNING: Antibiotic IV to Oral Route Change Policy  RECOMMENDATION: This patient is receiving Levofloxacin by the intravenous route.  Based on criteria approved by the Pharmacy and Therapeutics Committee, the antibiotic(s) is/are being converted to the equivalent oral dose form(s).   DESCRIPTION: These criteria include:  Patient being treated for a respiratory tract infection, urinary tract infection, cellulitis or clostridium difficile associated diarrhea if on metronidazole  The patient is not neutropenic and does not exhibit a GI malabsorption state  The patient is eating (either orally or via tube) and/or has been taking other orally administered medications for a least 24 hours  The patient is improving clinically and has a Tmax < 100.5  If you have questions about this conversion, please contact the Pharmacy Department  []   [phone]( [phone] )  [facility] [x]   [phone]( [phone]  [facility] []   [phone]( [phone] )  [facility] []   [phone]( [phone] )  [facility] []   [phone]( [phone] )  [facility]    Demetrius Charityeldrin D. Hyun Reali, PharmD, BCPS Clinical Pharmacist

## 2015-09-14 NOTE — Progress Notes (Signed)
Initial Nutrition Assessment   INTERVENTION:   Meals and Snacks: Cater to patient preferences, SLP following Medical Food Supplement Therapy: will recommend Ensure Enlive po TID, each supplement provides 350 kcal and 20 grams of protein   NUTRITION DIAGNOSIS:   Swallowing difficulty related to dysphagia as evidenced by  (diet order, SLP following).  GOAL:   Patient will meet greater than or equal to 90% of their needs  MONITOR:    (Energy Intake, Electrolyte and Renal Profile, Anthropometrics, Digestive System)  REASON FOR ASSESSMENT:   Consult Poor PO  ASSESSMENT:   Pt admitted with AMS secondary to UTI. Pt with h/o dementia, bedbound. Pt confused on visit, no family present.  Past Medical History  Diagnosis Date  . Dementia   . Hypertension   . Hyperlipidemia      Diet Order:  DIET DYS 2 Room service appropriate?: No; Fluid consistency:: Thin   Current Nutrition: Pt ate half of banana and 3 bites of breakfast tray with SLP this am.  Food/Nutrition-Related History: Unable to clarify with pt this am on visit. Per MST no decrease in appetite.   Scheduled Medications:  . carvedilol  3.125 mg Oral Daily  . donepezil  10 mg Oral QHS  . enoxaparin (LOVENOX) injection  40 mg Subcutaneous Q24H  . feeding supplement (ENSURE ENLIVE)  237 mL Oral TID WC  . levofloxacin  250 mg Oral Daily  . OLANZapine  10 mg Oral QHS  . pravastatin  20 mg Oral q1800  . vitamin B-12  1,000 mcg Oral Daily    Continuous Medications:  . sodium chloride 75 mL/hr at 09/13/15 1842     Electrolyte/Renal Profile and Glucose Profile:   Recent Labs Lab 09/13/15 1214 09/13/15 1803 09/14/15 0452  NA 142  --  137  K 3.8  --  3.8  CL 111  --  110  CO2 25  --  23  BUN 12  --  13  CREATININE 0.50 0.48 0.45  CALCIUM 9.1  --  8.4*  GLUCOSE 101*  --  98   Protein Profile:   Recent Labs Lab 09/13/15 1214  ALBUMIN 3.3*    Gastrointestinal Profile: Last BM:   Nutrition-Focused  Physical Exam Findings:  Unable to complete Nutrition-Focused physical exam at this time.    Weight Change: Unable to clarify with pt.   Skin:   (Stage I buttocks pressure ulcer)  Last BM:  09/14/2015  Height:   Ht Readings from Last 1 Encounters:  09/13/15 5\' 6"  (1.676 m)    Weight:   Wt Readings from Last 1 Encounters:  09/13/15 153 lb 5 oz (69.542 kg)     BMI:  Body mass index is 24.76 kg/(m^2).  Estimated Nutritional Needs:   Kcal:  BEE: 1200kcals, TEE: (IF 1.1-1.3)(AF 1.2) [id number]  Protein:  70-83g protein (1.0-1.2g/kg)  Fluid:  1738-202085mL of fluid (25-8030mL/kg)  EDUCATION NEEDS:   Education needs no appropriate at this time   HIGH Care Level  Leda QuailAllyson Fama Muenchow, RD, LDN Pager [phone]

## 2015-09-14 NOTE — Care Management (Addendum)
Admitted to [facility] with the diagnosis of altered mental status. Lives with brother, Nicolette BangRonald Petree [phone] x 2 years. [facility] in the past. No skilled facility. See Dr. Marcello FennelHande as primary care physician. Bedbound and brother helps with all activities of daily living.  Will need [facility] to transfer home.  Gwenette GreetBrenda S Merlyn Bollen RN MSN CCM Care Management [phone]

## 2015-09-15 LAB — URINE CULTURE

## 2015-09-15 MED ORDER — ENSURE ENLIVE PO LIQD: 237.0000 mL | Freq: Three times a day (TID) | ORAL | Status: AC

## 2015-09-15 MED ORDER — LEVOFLOXACIN 250 MG PO TABS: 250.0000 mg | ORAL_TABLET | Freq: Every day | ORAL | Status: AC

## 2015-09-15 NOTE — Progress Notes (Signed)
ANTIBIOTIC CONSULT NOTE - Follow Up  Pharmacy Consult for Levaquin Indication: UTI  Allergies  Allergen Reactions  . Penicillins Other (See Comments)    Reaction:  Unknown     Patient Measurements: Height:  (167.6 cm) Weight: 153 lb 5 oz (69.542 kg) IBW/kg (Calculated) : 59.3   Vital Signs: Temp: 97.4 F (36.3 C) (12/16 0525) Temp Source: Oral (12/16 0525) BP: 147/63 mmHg (12/16 0525) Pulse Rate: 67 (12/16 0525) Intake/Output from previous day: 12/15 0701 - 12/16 0700 In: 2696.3 [P.O.:960; I.V.:1736.3] Out: -  Intake/Output from this shift:    Labs:  Recent Labs  09/13/15 1214 09/13/15 1803 09/14/15 0452  WBC 12.2* 11.0 8.6  HGB 14.6 13.7 13.3  PLT 269 281 242  CREATININE 0.50 0.48 0.45   Estimated Creatinine Clearance: 56 mL/min (by C-G formula based on Cr of 0.45).   Microbiology: Recent Results (from the past 720 hour(s))  Urine culture     Status: None (Preliminary result)   Collection Time: 09/13/15 12:14 PM  Result Value Ref Range Status   Specimen Description URINE, CLEAN CATCH  Final   Special Requests NONE  Final   Culture   Final    >=100,000 COLONIES/mL PROTEUS MIRABILIS SUSCEPTIBILITIES TO FOLLOW    Report Status PENDING  Incomplete  Blood culture (routine x 2)     Status: None (Preliminary result)   Collection Time: 09/13/15  3:24 PM  Result Value Ref Range Status   Specimen Description BLOOD LEFT ARM  Final   Special Requests BOTTLES DRAWN AEROBIC AND ANAEROBIC 5CC  Final   Culture NO GROWTH < 24 HOURS  Final   Report Status PENDING  Incomplete  Blood culture (routine x 2)     Status: None (Preliminary result)   Collection Time: 09/13/15  3:24 PM  Result Value Ref Range Status   Specimen Description BLOOD RIGHT HAND  Final   Special Requests BOTTLES DRAWN AEROBIC AND ANAEROBIC 5CC  Final   Culture NO GROWTH < 24 HOURS  Final   Report Status PENDING  Incomplete    Medical History: Past Medical History  Diagnosis Date  .  Dementia   . Hypertension   . Hyperlipidemia     Medications:  Scheduled:  . carvedilol  3.125 mg Oral Daily  . donepezil  10 mg Oral QHS  . enoxaparin (LOVENOX) injection  40 mg Subcutaneous Q24H  . feeding supplement (ENSURE ENLIVE)  237 mL Oral TID WC  . levofloxacin  250 mg Oral Daily  . OLANZapine  10 mg Oral QHS  . pravastatin  20 mg Oral q1800  . vitamin B-12  1,000 mcg Oral Daily   Anti-infectives    Start     Dose/Rate Route Frequency Ordered Stop   09/14/15 1500  Levofloxacin (LEVAQUIN) IVPB 250 mg  Status:  Discontinued     250 mg 50 mL/hr over 60 Minutes Intravenous Every 24 hours 09/13/15 1739 09/14/15 0836   09/14/15 1000  levofloxacin (LEVAQUIN) tablet 250 mg    Comments:  Patient qualified for IV to PO transition per IV to PO policy. Will transition patient to PO therapy.   250 mg Oral Daily 09/14/15 0836     09/13/15 1430  levofloxacin (LEVAQUIN) IVPB 750 mg     750 mg 100 mL/hr over 90 Minutes Intravenous  Once 09/13/15 1419 09/13/15 1652     Assessment: 74 yo female with UTI (UA 2+ LE, + nitrite, 6-30 WBC). Per Brother, patient has been coughing with meals and  swallowing. CXR negative. SLP eval ordered Hx dementia. PCN allergy.  Plan:  Will continue with Levaquin 250mg  PO q24h for UTI dosing.  Continue to follow up on cultures/sensitivities.  Clarisa Schoolsrystal Arshdeep Bolger, PharmD Clinical Pharmacist 09/15/2015

## 2015-09-15 NOTE — Progress Notes (Signed)
Patient had no c/o pain this shift Brother at beside [facility]SS Received MD order to discharge patient to home, discharge instructions, home medications and prescriptions reviewed with brother and patient and he verbalized understanding, discharged in stretcher with EMS to home

## 2015-09-15 NOTE — Discharge Instructions (Signed)

## 2015-09-15 NOTE — Care Management (Signed)
Discharge to home today per Dr. Elisabeth PigeonVachhani. Will be transferred to home per [facility]. Brother, Mr. Fernanda Drumetree is in agreement with plan. Gwenette GreetBrenda S Narciso Stoutenburg RN MSN CCM Care Management [phone]

## 2015-09-15 NOTE — Progress Notes (Signed)
Speech Language Pathology Treatment: Dysphagia  Patient Details Name: Perlie GoldMargaret A Pinkstaff MRN: [id number] DOB: 01/30/1941 Today's Date: 09/15/2015 Time: 0930-1000 SLP Time Calculation (min) (ACUTE ONLY): 30 min  Assessment / Plan / Recommendation Clinical Impression  Pt appears to be tolerating her current rec'd diet of Dys. 2 w/ thin liquids; pt easily tolerates puree consistency foods as well. Pt exhibited no overt s/s of aspiration w/ trials during session; clear vocal quality post trials of thin liquids. Pt required encouragement to take po's sec. To her confusion and declined Cognitive status. Pt will be going home w/ her brother who has been her primary caregiver. He gave understanding of general aspiration precautions discussed w/ him via phone call yesterday. Handouts given(put in packet for d/c home) on diet consistency and food prep; aspiration precautions. NSG updated.    HPI HPI: Pt is a 74 y.o. female with a known history of dementia, hypertension, hyperlipidemia who presents to the hospital brought in by her brother due to altered mental status. Herself has baseline dementia and therefore most of the history obtained from the brother at bedside. As per the brother he says that the patient was more lethargic and noncommunicative today. She is normally bed bound and he helps turn her every few hours and usually she is arousable but today she was unresponsive. He also noted that her body felt a bit warm and therefore he called EMS this morning with brought her to the emergency room. Patient was noted to have urinary tract infection and also noted to have a mild leukocytosis. Pt has severe confusion/ Cognitive decline and verbalizes intermittently; unable to follow commands but can be directed to take po's w/ moderate encouragement and visual cues. Pt's brother later stated that pt did have some degree of dysphagia "a few years ago" but that she had been eating a soft foods diet at home drinking thin  liquids. He stated he was "careful" during meals w/ her.       SLP Plan  Continue with current plan of care     Recommendations  Diet recommendations: Dysphagia 2 (fine chop);Thin liquid Liquids provided via: Cup;Straw Medication Administration: Crushed with puree Supervision: Full supervision/cueing for compensatory strategies;Staff to assist with self feeding;Trained caregiver to feed patient Compensations: Minimize environmental distractions;Slow rate;Small sips/bites;Follow solids with liquid Postural Changes and/or Swallow Maneuvers: Seated upright 90 degrees              General recommendations:  (TBD) Oral Care Recommendations: Oral care BID;Staff/trained caregiver to provide oral care Follow up Recommendations:  (Dietician consult for recs.) Plan: Continue with current plan of care   Jerilynn SomKatherine Yatzil Clippinger, MS, CCC-SLP  Mauriana Dann 09/15/2015, 12:35 PM

## 2015-09-15 NOTE — Discharge Summary (Signed)
[facility] [place] [facility]   PATIENT NAME: Karina CrockerMargaret Hostetler    MR#:  [id number]  DATE OF BIRTH:  05/17/1941  DATE OF ADMISSION:  09/13/2015 ADMITTING PHYSICIAN: Houston SirenVivek J Sainani, MD  DATE OF DISCHARGE: 09/15/2015  PRIMARY CARE PHYSICIAN: Barbette ReichmannHANDE,VISHWANATH, MD    ADMISSION DIAGNOSIS:  UTI (lower urinary tract infection) [N39.0] Altered mental status, unspecified altered mental status type [R41.82]  DISCHARGE DIAGNOSIS:  Active Problems:   Altered mental status   Pressure ulcer   SECONDARY DIAGNOSIS:   Past Medical History  Diagnosis Date  . Dementia   . Hypertension   . Hyperlipidemia     HOSPITAL COURSE:   #1 altered mental status-this is likely metabolic encephalopathy from the UTI. Patient CT head on admission is negative for any acute pathology. - IV fluids, IV antibiotics and follow her mental status.  #2 urinary tract infection-treated with IV Levaquin and follow urine culture.  Have proteus- reviewed sensitivity. Sensitive to all routine ABx.  #3 dysphagia-as per the patient's brother she has been coughing with meals and also when she swallows. - SLP evaluation. Dysphagia diet. -Her chest x-ray presently is negative for any acute aspiration pneumonitis.  #4 hypertension-continue Coreg.  #5 dementia-continue Aricept.  #6 hyperlipidemia-continue Pravachol.   DISCHARGE CONDITIONS:   Stable.  CONSULTS OBTAINED:     DRUG ALLERGIES:   Allergies  Allergen Reactions  . Penicillins Other (See Comments)    Reaction:  Unknown     DISCHARGE MEDICATIONS:   Current Discharge Medication List    START taking these medications   Details  feeding supplement, ENSURE ENLIVE, (ENSURE ENLIVE) LIQD Take 237 mLs by mouth 3 (three) times daily with meals. Qty: 237 mL, Refills: 12    levofloxacin (LEVAQUIN) 250 MG tablet Take 1 tablet (250 mg total) by mouth daily. Qty: 3 tablet, Refills: 0      CONTINUE these medications  which have NOT CHANGED   Details  carvedilol (COREG) 3.125 MG tablet Take 3.125 mg by mouth daily.    donepezil (ARICEPT) 10 MG tablet Take 10 mg by mouth at bedtime.    lovastatin (MEVACOR) 20 MG tablet Take 20 mg by mouth at bedtime.    OLANZapine (ZYPREXA) 10 MG tablet Take 10 mg by mouth at bedtime.    vitamin B-12 (CYANOCOBALAMIN) 1000 MCG tablet Take 1,000 mcg by mouth daily.         DISCHARGE INSTRUCTIONS:    Follow with PMD as routine.  If you experience worsening of your admission symptoms, develop shortness of breath, life threatening emergency, suicidal or homicidal thoughts you must seek medical attention immediately by calling 911 or calling your MD immediately  if symptoms less severe.  You Must read complete instructions/literature along with all the possible adverse reactions/side effects for all the Medicines you take and that have been prescribed to you. Take any new Medicines after you have completely understood and accept all the possible adverse reactions/side effects.   Please note  You were cared for by a hospitalist during your hospital stay. If you have any questions about your discharge medications or the care you received while you were in the hospital after you are discharged, you can call the unit and asked to speak with the hospitalist on call if the hospitalist that took care of you is not available. Once you are discharged, your primary care physician will handle any further medical issues. Please note that NO REFILLS for any discharge medications will be authorized once  you are discharged, as it is imperative that you return to your primary care physician (or establish a relationship with a primary care physician if you do not have one) for your aftercare needs so that they can reassess your need for medications and monitor your lab values.    Today   CHIEF COMPLAINT:   Chief Complaint  Patient presents with  . Altered Mental Status    HISTORY  OF PRESENT ILLNESS:  Karina [name]  is a 74 y.o. female with a known history of dementia, hypertension, hyperlipidemia who presents to the hospital brought in by her brother due to altered mental status. Herself has baseline dementia and therefore most of the history obtained from the brother at bedside. As per the brother he says that the patient was more lethargic and noncommunicative today. She is normally bed bound and he helps turn her every few hours and usually she is arousable but today she was unresponsive. He also noted that her body felt a bit warm and therefore he called EMS this morning with brought her to the emergency room. Patient was noted to have urinary tract infection and also noted to have a mild leukocytosis. Hospitalist services were contacted further treatment and evaluation.   VITAL SIGNS:  Blood pressure 150/73, pulse 74, temperature 97.4 F (36.3 C), temperature source Oral, resp. rate 18, height 5\' 6"  (1.676 m), weight 69.542 kg (153 lb 5 oz), SpO2 98 %.  I/O:   Intake/Output Summary (Last 24 hours) at 09/15/15 1101 Last data filed at 09/15/15 0956  Gross per 24 hour  Intake 3045.25 ml  Output      0 ml  Net 3045.25 ml    PHYSICAL EXAMINATION:   GENERAL: 74 y.o.-year-old patient lying in the bed with no acute distress.  EYES: Pupils equal, round, reactive to light and accommodation. No scleral icterus. Extraocular muscles intact.  HEENT: Head atraumatic, normocephalic. Oropharynx and nasopharynx clear.  NECK: Supple, no jugular venous distention. No thyroid enlargement, no tenderness.  LUNGS: Normal breath sounds bilaterally, no wheezing, rales,rhonchi or crepitation. No use of accessory muscles of respiration.  CARDIOVASCULAR: S1, S2 normal. No murmurs, rubs, or gallops.  ABDOMEN: Soft, nontender, nondistended. Bowel sounds present. No organomegaly or mass.  EXTREMITIES: No pedal edema, cyanosis, or clubbing.  NEUROLOGIC: Cranial nerves II through  XII are intact. Muscle strength 4/5 in upper limbs, lower limbs 0/5, and atrophic changes on foot muscles. PSYCHIATRIC: The patient is alert but not oriented, not talking much.  SKIN: sacral decubitus ulcer.   DATA REVIEW:   CBC  Recent Labs Lab 09/14/15 0452  WBC 8.6  HGB 13.3  HCT 39.2  PLT 242    Chemistries   Recent Labs Lab 09/13/15 1214  09/14/15 0452  NA 142  --  137  K 3.8  --  3.8  CL 111  --  110  CO2 25  --  23  GLUCOSE 101*  --  98  BUN 12  --  13  CREATININE 0.50  < > 0.45  CALCIUM 9.1  --  8.4*  AST 20  --   --   ALT 21  --   --   ALKPHOS 73  --   --   BILITOT 0.9  --   --   < > = values in this interval not displayed.  Cardiac Enzymes  Recent Labs Lab 09/13/15 1214  TROPONINI <0.03    Microbiology Results  Results for orders placed or performed during the hospital  encounter of 09/13/15  Urine culture     Status: None   Collection Time: 09/13/15 12:14 PM  Result Value Ref Range Status   Specimen Description URINE, CLEAN CATCH  Final   Special Requests NONE  Final   Culture >=100,000 COLONIES/mL PROTEUS MIRABILIS  Final   Report Status 09/15/2015 FINAL  Final   Organism ID, Bacteria PROTEUS MIRABILIS  Final      Susceptibility   Proteus mirabilis - MIC*    AMPICILLIN <=2 SENSITIVE Sensitive     CEFAZOLIN <=4 SENSITIVE Sensitive     CEFTRIAXONE <=1 SENSITIVE Sensitive     CIPROFLOXACIN <=0.25 SENSITIVE Sensitive     GENTAMICIN <=1 SENSITIVE Sensitive     IMIPENEM 2 SENSITIVE Sensitive     NITROFURANTOIN 128 RESISTANT Resistant     TRIMETH/SULFA <=20 SENSITIVE Sensitive     PIP/TAZO Value in next row Sensitive      SENSITIVE<=4    LEVOFLOXACIN Value in next row Sensitive      SENSITIVE<=0.12    * >=100,000 COLONIES/mL PROTEUS MIRABILIS  Blood culture (routine x 2)     Status: None (Preliminary result)   Collection Time: 09/13/15  3:24 PM  Result Value Ref Range Status   Specimen Description BLOOD LEFT ARM  Final   Special Requests  BOTTLES DRAWN AEROBIC AND ANAEROBIC 5CC  Final   Culture NO GROWTH < 24 HOURS  Final   Report Status PENDING  Incomplete  Blood culture (routine x 2)     Status: None (Preliminary result)   Collection Time: 09/13/15  3:24 PM  Result Value Ref Range Status   Specimen Description BLOOD RIGHT HAND  Final   Special Requests BOTTLES DRAWN AEROBIC AND ANAEROBIC 5CC  Final   Culture NO GROWTH < 24 HOURS  Final   Report Status PENDING  Incomplete    RADIOLOGY:  Dg Chest 2 View  09/13/2015  CLINICAL DATA:  Altered mental status.  Former smoker. EXAM: CHEST  2 VIEW COMPARISON:  02/17/2012 FINDINGS: Grossly unchanged enlarged cardiac silhouette and mediastinal contours. Evaluation the retrosternal clear space obscured secondary to overlying soft tissues. Done opacities overlying the bilateral lower lungs are favored to represent overlying breast tissues. No discrete focal airspace opacities. There is grossly unchanged mild diffuse slightly nodular thickening of the pulmonary interstitium. No discrete focal airspace opacities. No pleural effusion or pneumothorax. No evidence of edema. No acute osseus abnormalities. IMPRESSION: Cardiomegaly and bronchitic change without acute cardiopulmonary disease. Electronically Signed   By: Simonne Come M.D.   On: 09/13/2015 13:05   Ct Head Wo Contrast  09/13/2015  CLINICAL DATA:  Altered mental status. EXAM: CT HEAD WITHOUT CONTRAST TECHNIQUE: Contiguous axial images were obtained from the base of the skull through the vertex without intravenous contrast. COMPARISON:  CT scan of Feb 17, 2012. FINDINGS: Bony calvarium appears intact. Mild diffuse cortical atrophy is noted. Mild chronic ischemic white matter disease is noted. No mass effect or midline shift is noted. Ventricular size is within normal limits. There is no evidence of mass lesion, hemorrhage or acute infarction. IMPRESSION: Mild diffuse cortical atrophy. Mild chronic ischemic white matter disease. No acute  intracranial abnormality seen. Electronically Signed   By: Lupita Raider, M.D.   On: 09/13/2015 13:13     Management plans discussed with the patient, family and they are in agreement.  CODE STATUS:     Code Status Orders        Start     Ordered   09/13/15  1740  Full code   Continuous     09/13/15 1739    Advance Directive Documentation        Most Recent Value   Type of Advance Directive  Healthcare Power of Attorney, Living will   Pre-existing out of facility DNR order (yellow form or pink MOST form)     "MOST" Form in Place?        TOTAL TIME TAKING CARE OF THIS PATIENT: 35 minutes.    Altamese Dilling M.D on 09/15/2015 at 11:01 AM  Between 7am to 6pm - Pager - [phone]  After 6pm go to [email or web address] - password [email or web address]  [facility]  Office  [phone]  CC: Primary care physician; Barbette Reichmann, MD   Note: This dictation was prepared with Dragon dictation along with smaller phrase technology. Any transcriptional errors that result from this process are unintentional.

## 2015-09-15 NOTE — Plan of Care (Signed)
Problem: Skin Integrity: Goal: Risk for impaired skin integrity will decrease Outcome: Progressing No c/o pain nor distress noted. Non-verbal at times, will speak a few incoherent words. Continue to monitor.

## 2015-09-18 LAB — CULTURE, BLOOD (ROUTINE X 2)
CULTURE: NO GROWTH
CULTURE: NO GROWTH

## 2016-10-18 ENCOUNTER — Encounter: Payer: Self-pay | Admitting: Medical Oncology

## 2016-10-18 ENCOUNTER — Emergency Department

## 2016-10-18 ENCOUNTER — Emergency Department
Admission: EM | Admit: 2016-10-18 | Discharge: 2016-10-18 | Disposition: A | Discharge status: Home / Self Care | Attending: Emergency Medicine | Admitting: Emergency Medicine

## 2016-10-18 DIAGNOSIS — Z79899 Other long term (current) drug therapy: Secondary | ICD-10-CM | POA: Insufficient documentation

## 2016-10-18 DIAGNOSIS — R4182 Altered mental status, unspecified: Secondary | ICD-10-CM

## 2016-10-18 DIAGNOSIS — R0682 Tachypnea, not elsewhere classified: Secondary | ICD-10-CM | POA: Diagnosis not present

## 2016-10-18 DIAGNOSIS — E860 Dehydration: Secondary | ICD-10-CM | POA: Diagnosis not present

## 2016-10-18 DIAGNOSIS — F0390 Unspecified dementia without behavioral disturbance: Secondary | ICD-10-CM | POA: Diagnosis not present

## 2016-10-18 DIAGNOSIS — R4020 Unspecified coma: Secondary | ICD-10-CM | POA: Diagnosis present

## 2016-10-18 DIAGNOSIS — Z87891 Personal history of nicotine dependence: Secondary | ICD-10-CM | POA: Diagnosis not present

## 2016-10-18 DIAGNOSIS — A419 Sepsis, unspecified organism: Secondary | ICD-10-CM | POA: Diagnosis not present

## 2016-10-18 DIAGNOSIS — I10 Essential (primary) hypertension: Secondary | ICD-10-CM | POA: Diagnosis not present

## 2016-10-18 DIAGNOSIS — R4189 Other symptoms and signs involving cognitive functions and awareness: Secondary | ICD-10-CM

## 2016-10-18 DIAGNOSIS — Z515 Encounter for palliative care: Secondary | ICD-10-CM | POA: Diagnosis not present

## 2016-10-18 LAB — URINALYSIS, COMPLETE (UACMP) WITH MICROSCOPIC
BILIRUBIN URINE: NEGATIVE
Glucose, UA: 50 mg/dL — AB
KETONES UR: 5 mg/dL — AB
LEUKOCYTES UA: NEGATIVE
Nitrite: NEGATIVE
Specific Gravity, Urine: 1.033 — ABNORMAL HIGH (ref 1.005–1.030)
pH: 5 (ref 5.0–8.0)

## 2016-10-18 LAB — COMPREHENSIVE METABOLIC PANEL
ALBUMIN: 3.5 g/dL (ref 3.5–5.0)
ALK PHOS: 75 U/L (ref 38–126)
ALT: 16 U/L (ref 14–54)
AST: 27 U/L (ref 15–41)
Anion gap: 10 (ref 5–15)
BILIRUBIN TOTAL: 0.8 mg/dL (ref 0.3–1.2)
BUN: 27 mg/dL — AB (ref 6–20)
CO2: 24 mmol/L (ref 22–32)
Calcium: 9.8 mg/dL (ref 8.9–10.3)
Chloride: 116 mmol/L — ABNORMAL HIGH (ref 101–111)
Creatinine, Ser: 0.62 mg/dL (ref 0.44–1.00)
GFR calc Af Amer: >60 mL/min (ref >60)
GFR calc non Af Amer: >60 mL/min (ref >60)
GLUCOSE: 197 mg/dL — AB (ref 65–99)
POTASSIUM: 3.5 mmol/L (ref 3.5–5.1)
Sodium: 150 mmol/L — ABNORMAL HIGH (ref 135–145)
TOTAL PROTEIN: 7.5 g/dL (ref 6.5–8.1)

## 2016-10-18 LAB — CBC WITH DIFFERENTIAL/PLATELET
BASOS ABS: 0.2 10*3/uL — AB (ref 0–0.1)
BASOS PCT: 1 %
Eosinophils Absolute: 0 10*3/uL (ref 0–0.7)
Eosinophils Relative: 0 %
HCT: 43 % (ref 35.0–47.0)
HEMOGLOBIN: 14.6 g/dL (ref 12.0–16.0)
Lymphocytes Relative: 4 %
Lymphs Abs: 0.8 10*3/uL — ABNORMAL LOW (ref 1.0–3.6)
MCH: 30.2 pg (ref 26.0–34.0)
MCHC: 33.9 g/dL (ref 32.0–36.0)
MCV: 89.2 fL (ref 80.0–100.0)
Monocytes Absolute: 1.7 10*3/uL — ABNORMAL HIGH (ref 0.2–0.9)
Monocytes Relative: 8 %
NEUTROS PCT: 87 %
Neutro Abs: 18.3 10*3/uL — ABNORMAL HIGH (ref 1.4–6.5)
Platelets: 432 10*3/uL (ref 150–440)
RBC: 4.82 MIL/uL (ref 3.80–5.20)
RDW: 12.8 % (ref 11.5–14.5)
WBC: 21 10*3/uL — AB (ref 3.6–11.0)

## 2016-10-18 LAB — LIPASE, BLOOD: Lipase: 21 U/L (ref 11–51)

## 2016-10-18 LAB — LACTIC ACID, PLASMA: LACTIC ACID, VENOUS: 2.2 mmol/L — AB (ref 0.5–1.9)

## 2016-10-18 MED ORDER — MORPHINE SULFATE (PF) 2 MG/ML IV SOLN
1.0000 mg | INTRAVENOUS | Status: DC | PRN
  Administered 2016-10-18: 1 mg via INTRAVENOUS
  Filled 2016-10-18: qty 1

## 2016-10-18 MED ORDER — MORPHINE SULFATE (PF) 2 MG/ML IV SOLN
1.0000 mg | INTRAVENOUS | Status: AC
  Administered 2016-10-18: 1 mg via INTRAVENOUS
  Filled 2016-10-18: qty 1

## 2016-10-18 MED ORDER — GLYCOPYRROLATE 0.2 MG/ML IJ SOLN: 0.2000 mg | INTRAMUSCULAR | Status: DC | PRN

## 2016-10-18 MED ORDER — LORAZEPAM 2 MG/ML IJ SOLN: 0.5000 mg | INTRAMUSCULAR | Status: DC | PRN

## 2016-10-18 NOTE — ED Provider Notes (Addendum)
[facility] Emergency Department Provider Note   ____________________________________________   First MD Initiated Contact with Patient 10/18/16 1003     (approximate)  I have reviewed the triage vital signs and the nursing notes.   HISTORY  Chief Complaint Altered Mental Status  EM caveat: The patient is completely unresponsive  HPI Karina [name] is a 76 y.o. female here for evaluation of unresponsiveness. The patient's brother provides history over the phone. Spoke to the patient's brother via phone his phone number [phone]. He identifies himself as her only family, her primary caregiver and her healthcare decision-maker/power of attorney.  Patient's brother reports that she became unresponsive last night, was gurgling with respirations. He contacted hospice today but they were unable, rales due to weather. They advised calling and he had EMS come as he needed to provide additional care for her understanding that this is probably time to time.  The patient's brother, who identifies as her medical power of attorney had a discussion with me and he clearly indicates that the goals of care are for comfort care, he would not want for her to receive any invasive or life-saving measures such as being on a breathing machine or artificial life support. She comes with a complete DO NOT RESUSCITATE. She is DO NOT INTUBATE.  Brother would like for hospice involvement, wishes to keep her comfortable and understands it he believes that she is likely dying.   Past Medical History:  Diagnosis Date  . Dementia   . Hyperlipidemia   . Hypertension     Patient Active Problem List   Diagnosis Date Noted  . Dementia without behavioral disturbance   . Tachypnea   . Palliative care by specialist   . Terminal care   . Pressure ulcer 09/14/2015  . Altered mental status 09/13/2015    History reviewed. No pertinent surgical history.  Prior to Admission  medications   Medication Sig Start Date End Date Taking? Authorizing Provider  carvedilol (COREG) 3.125 MG tablet Take 3.125 mg by mouth daily.    Historical Provider, MD  donepezil (ARICEPT) 10 MG tablet Take 10 mg by mouth at bedtime.    Historical Provider, MD  feeding supplement, ENSURE ENLIVE, (ENSURE ENLIVE) LIQD Take 237 mLs by mouth 3 (three) times daily with meals. 09/15/15   Altamese Dilling, MD  levofloxacin (LEVAQUIN) 250 MG tablet Take 1 tablet (250 mg total) by mouth daily. 09/15/15   Altamese Dilling, MD  lovastatin (MEVACOR) 20 MG tablet Take 20 mg by mouth at bedtime.    Historical Provider, MD  OLANZapine (ZYPREXA) 10 MG tablet Take 10 mg by mouth at bedtime.    Historical Provider, MD  vitamin B-12 (CYANOCOBALAMIN) 1000 MCG tablet Take 1,000 mcg by mouth daily.    Historical Provider, MD    Allergies Penicillins  Family History  Problem Relation Age of Onset  . Stroke Mother   . Coronary artery disease Father   . Depression Other     Social History Social History  Substance Use Topics  . Smoking status: Former Games developer  . Smokeless tobacco: Not on file  . Alcohol use No    Review of Systems   ____________________________________________   PHYSICAL EXAM:  VITAL SIGNS: ED Triage Vitals  Enc Vitals Group     BP 10/18/16 0948 (!) 156/94     Pulse Rate 10/18/16 0948 (!) 124     Resp 10/18/16 0948 (!) 30     Temp 10/18/16 0948 (!) 100.9 F (38.3  C)     Temp Source 10/18/16 0948 Rectal     SpO2 10/18/16 0948 (!) 89 %     Weight 10/18/16 0949 153 lb (69.4 kg)     Height --      Head Circumference --      Peak Flow --      Pain Score --      Pain Loc --      Pain Edu? --      Excl. in GC? --     Constitutional: Unresponsive, disconjugate gaze, patient does at her eyes to touch of the eyelids, but does not track or follow any commands. All extremities are flaccid. The patient does not appear to any pain. She is laying quite placidly and mild  to moderate tachypnea Eyes: Conjunctivae are normal.  Head: Atraumatic. Nose: No congestion/rhinnorhea. Mouth/Throat: Mucous membranes are moist.  Oropharynx non-erythematous. Neck: No stridor.  Cardiovascular: Tachycardic rate, regular rhythm. Grossly normal heart sounds.  Good peripheral circulation. Respiratory: Tachypnea, moderate rales/rhonchi throughout both lobes.  No wheezing Gastrointestinal: Soft and nontender. No distention.  Musculoskeletal: No lower extremity tenderness nor edema.   Neurologic:  Unresponsive, no posturing. Flaccid in all extremities Skin:  Skin is warm, dry and intact. No rash noted.   ____________________________________________   LABS (all labs ordered are listed, but only abnormal results are displayed)  Labs Reviewed  LACTIC ACID, PLASMA - Abnormal; Notable for the following:       Result Value   Lactic Acid, Venous 2.2 (*)    All other components within normal limits  CBC WITH DIFFERENTIAL/PLATELET - Abnormal; Notable for the following:    WBC 21.0 (*)    Neutro Abs 18.3 (*)    Lymphs Abs 0.8 (*)    Monocytes Absolute 1.7 (*)    Basophils Absolute 0.2 (*)    All other components within normal limits  COMPREHENSIVE METABOLIC PANEL - Abnormal; Notable for the following:    Sodium 150 (*)    Chloride 116 (*)    Glucose, Bld 197 (*)    BUN 27 (*)    All other components within normal limits  LIPASE, BLOOD  LACTIC ACID, PLASMA  URINALYSIS, COMPLETE (UACMP) WITH MICROSCOPIC   ____________________________________________  EKG  Reviewed and interpreted by me at 950 Heart rate 120 QRS 70 QTc 4:30 Sinus tachycardia, nonspecific T-wave abnormality, minimal depressions noted, cannot rule out ischemic change ____________________________________________  RADIOLOGY  Dg Chest 1 View  Result Date: 10/18/2016 CLINICAL DATA:  Altered mental status.  Unresponsiveness. EXAM: CHEST 1 VIEW COMPARISON:  09/13/2015 FINDINGS: Heart size and pulmonary  vascularity are normal. Minimal atelectasis at the lung bases. Lungs are otherwise clear. No appreciable effusions. No significant bone abnormality. IMPRESSION: Minimal atelectasis at the lung bases. Electronically Signed   By: Francene BoyersJames  Maxwell M.D.   On: 10/18/2016 10:21    ____________________________________________   PROCEDURES  Procedure(s) performed: None  Procedures  Critical Care performed: No  ____________________________________________   INITIAL IMPRESSION / ASSESSMENT AND PLAN / ED COURSE  Pertinent labs & imaging results that were available during my care of the patient were reviewed by me and considered in my medical decision making (see chart for details).  Patient presents with critical change in her mental status. She appears to also increased work of breathing, fever tachycardia and appears likely to have sepsis. In discussion with the patient's healthcare power of attorney, as the patient is not able to provide her own discussion and lax capacity at present, her  brother who is her power of attorney advises comfort measures only. He is agreeable with the plan to not treat any acute illness, labs done for diagnostic purposes, and he wishes for her to be coordinated with hospice services for care. As the treating physician, I believe the patient is likely suffering from severe sepsis with significant alteration in mental status, and given the patient's goals of care I will not initiate antibiotics. We will provide comfort care and have obtained palliative care consultation for further. Patient's family very clear understanding the patient is likely acutely dying.     ----------------------------------------- 12:21 PM on 10/18/2016 -----------------------------------------  Arrangements have been made with plan for patient to go to [facility]. Patient's breath, medical power of attorney in agreement with plan for comfort measures only. Hospice team is seen and  consulted on the patient, and patient will be discharged once a bed becomes available. The patient does not appear to be in any acute distress or pain at this time.   FINAL CLINICAL IMPRESSION(S) / ED DIAGNOSES  Final diagnoses:  Sepsis, due to unspecified organism (HCC)  Unresponsiveness      NEW MEDICATIONS STARTED DURING THIS VISIT:  New Prescriptions   No medications on file     Note:  This document was prepared using Dragon voice recognition software and may include unintentional dictation errors.     Sharyn Creamer, MD 10/18/16 1222    Sharyn Creamer, MD 11/05/16 [id number]

## 2016-10-18 NOTE — Consult Note (Signed)
Consultation Note Date: 10/18/2016   Patient Name: Karina [name]  DOB: 09-04-1941  MRN: [id number]  Age / Sex: 76 y.o., female  PCP: Barbette Reichmann, MD Referring Physician: Sharyn Creamer, MD  Reason for Consultation: Terminal Care  HPI/Patient Profile: 76 y.o. female  with past medical history of hypertension, hyperlipidemia, and dementia admitted on 10/18/2016 unresponsive from home with hospice services. Per POA (brother), patient had a coughing spell last night and became unresponsive. He notified the emergency hospice number but per brother, they were unable to make it to the house so he called EMS. Palliative medicine consultation for terminal care.   Clinical Assessment and Goals of Care: Reviewed medical records, discussed with Dr. Fanny Bien, and assessed the patient who appears to be actively dying. She is unresponsive with tachycardia and tachypnea.  Spoke with Ron, brother and POA via telephone. No other family members. She has had hospice services at home for dementia. Ron states she has been unresponsive since last night after a coughing spell. He understands she is dying and "just wants her to be comfortable." He speaks of her decline in the last four months.   Discussed comfort measures and medications for symptom management. I have discussed with hospice liaison and brother agreeable with transfer from ED to hospice home if bed available and if she is stable for transfer.    SUMMARY OF RECOMMENDATIONS    DNR/DNI  Comfort measures only. No escalation of care. Current hospice patient.  Symptom management--see below.   RN to give Morphine 1mg  now for tachypnea and tachycardia and transition from NRB mask to 2L [place].  Discussed with Clydie Braun, hospice liaison. If stable, patient may transfer to hospice home.   Code Status/Advance Care Planning:  DNR   Symptom Management:   Morphine 1mg  IV q29min  prn pain/dyspnea/air hunger/tachypnea  Ativan 0.5mg  IV q2h prn anxiety  Robinul 0.2mg  IV q4h prn secretions  Palliative Prophylaxis:   Aspiration, Delirium Protocol, Frequent Pain Assessment, Oral Care and Turn Reposition  Additional Recommendations (Limitations, Scope, Preferences):  Full Comfort Care  Psycho-social/Spiritual:   Desire for further Chaplaincy support:yes  Additional Recommendations: Caregiving  Support/Resources and Education on Hospice  Prognosis:   Hours - Days  Discharge Planning: Hospice facility      Primary Diagnoses: Present on Admission: **None**   I have reviewed the medical record, interviewed the patient and family, and examined the patient. The following aspects are pertinent.  Past Medical History:  Diagnosis Date  . Dementia   . Hyperlipidemia   . Hypertension    Social History   Social History  . Marital status: Widowed    Spouse name: N/A  . Number of children: N/A  . Years of education: N/A   Social History Main Topics  . Smoking status: Former Games developer  . Smokeless tobacco: None  . Alcohol use No  . Drug use: Unknown  . Sexual activity: No   Other Topics Concern  . None   Social History Narrative  . None   Family History  Problem Relation Age of Onset  . Stroke Mother   . Coronary artery disease Father   . Depression Other    Scheduled Meds: Continuous Infusions: PRN Meds:.glycopyrrolate, LORazepam, morphine injection Medications Prior to Admission:  Prior to Admission medications   Medication Sig Start Date End Date Taking? Authorizing Provider  carvedilol (COREG) 3.125 MG tablet Take 3.125 mg by mouth daily.    Historical Provider, MD  donepezil (ARICEPT) 10 MG tablet Take 10 mg by mouth at bedtime.    Historical Provider, MD  feeding supplement, ENSURE ENLIVE, (ENSURE ENLIVE) LIQD Take 237 mLs by mouth 3 (three) times daily with meals. 09/15/15   Altamese DillingVaibhavkumar Vachhani, MD  levofloxacin (LEVAQUIN) 250 MG  tablet Take 1 tablet (250 mg total) by mouth daily. 09/15/15   Altamese DillingVaibhavkumar Vachhani, MD  lovastatin (MEVACOR) 20 MG tablet Take 20 mg by mouth at bedtime.    Historical Provider, MD  OLANZapine (ZYPREXA) 10 MG tablet Take 10 mg by mouth at bedtime.    Historical Provider, MD  vitamin B-12 (CYANOCOBALAMIN) 1000 MCG tablet Take 1,000 mcg by mouth daily.    Historical Provider, MD   Allergies  Allergen Reactions  . Penicillins Other (See Comments)    Reaction:  Unknown    Review of Systems  Unable to perform ROS: Acuity of condition   Physical Exam  Constitutional: She appears ill. She appears distressed.  HENT:  Head: Normocephalic and atraumatic.  Cardiovascular: Normal heart sounds.  Tachycardia present.   Pulmonary/Chest: Accessory muscle usage present. Tachypnea noted. She has decreased breath sounds.  RN to give morphine  Abdominal: Normal appearance. Bowel sounds are decreased.  Neurological: She is unresponsive.  Skin: Skin is warm. She is diaphoretic. There is pallor.  Nursing note and vitals reviewed.  Vital Signs: BP (!) 151/96   Pulse (!) 120   Temp (!) 100.9 F (38.3 C) (Rectal)   Resp (!) 32   Wt 69.4 kg (153 lb)   SpO2 96%   BMI 24.69 kg/m  Pain Assessment: PAINAD     SpO2: SpO2: 96 % O2 Device:SpO2: 96 % O2 Flow Rate: .   IO: Intake/output summary: No intake or output data in the 24 hours ending 10/18/16 1112  LBM:   Baseline Weight: Weight: 69.4 kg (153 lb) Most recent weight: Weight: 69.4 kg (153 lb)     Palliative Assessment/Data: PPS 10%   Flowsheet Rows   Flowsheet Row Most Recent Value  Intake Tab  Referral Department  -- [Emergency Department]  Unit at Time of Referral  ER  Palliative Care Primary Diagnosis  Neurology  Palliative Care Type  New Palliative care  Reason for referral  End of Life Care Assistance  Date of Admission  10/18/16  Date first seen by Palliative Care  10/18/16  Clinical Assessment  Palliative Performance Scale  Score  10%  Psychosocial & Spiritual Assessment  Palliative Care Outcomes  Patient/Family meeting held?  Yes  Who was at the meeting?  brother via telephone  Palliative Care Outcomes  Clarified goals of care, Counseled regarding hospice, Provided psychosocial or spiritual support, Provided end of life care assistance, Improved pain interventions, Improved non-pain symptom therapy, Transitioned to hospice      Time In: 1015 Time Out: 1105 Time Total: 50min Greater than 50%  of this time was spent counseling and coordinating care related to the above assessment and plan.  Signed by:  Vennie HomansMegan Charnay Nazario, FNP-C Palliative Medicine Team  Phone: [phone] Fax: [phone]   Please contact Palliative Medicine  Team phone at [phone] for questions and concerns.  For individual provider: See Shea Evans

## 2016-10-18 NOTE — ED Triage Notes (Signed)
Pt from home with hospice care by ems with reports from husband that pt had a "coughing spell" last night around 2300 and since then pt has been unresponsive. When ems arrived o2 sats were 85% on RA. Pt does not have o2 at home, per husband he knows she is "end of life care" but he feels like she is not comfortable and due to weather pts hospice nurse has not been able to make it out to their house.

## 2016-10-18 NOTE — Discharge Instructions (Signed)
Your sister is being discharged for comfort care under the hospice service. Your sister will be transferred to [facility], where she'll have ongoing care for her comfort during her time of dying.  Please keep close contact with her hospice team, and you welcome to call or return her to the emergency room at any time should you wish.

## 2016-10-18 NOTE — Progress Notes (Signed)
ED visit made. Patient is currently followed by [facility] at home with a hospice diagnosis of Alzheimer's disease and she is a DNR. She was brought to the ED via EMS today for evaluation of non responsiveness. Per chart note review and hospice nurse report patient had an episode of coughing and foaming last night, she was then unresponsive, her brother called EMS and she was transported.  Patient seen lying on the ED stretcher eyes closed, no response to voice or gentle touch. Respirations elevated, heart rate elevated, nonrebreather mask in place. Palliative Medicine NP Vennie HomansMegan Mason present. Patient's brother Ron contacted and voiced his desire to focus on comfort. Staff RN Herbert Setaheather and Ed attending physician Dr. Fanny BienQuale updated. Patient placed on nasal cannula, and comfort [facility]. Plan is for transport to the hospice home for end of life care. Writer spoke to patient's brother Ron when he arrived, he remained in agreement with the plan for comfort at the hospice home. Report called to the hospice home, EMS notified for transport. Ron and hospital care team all aware. Updated notes faxed to hospice home. Thank you. Dayna BarkerKaren Robertson RN, BSN, [facility], [facility] [phone] c

## 2016-10-18 NOTE — ED Notes (Signed)
Pt taken off of NRB and placed on 2L [place].

## 2016-10-31 DEATH — deceased

## 2017-06-22 IMAGING — CT CT HEAD W/O CM
1 of 2 series · 13 of 30 positions shown, 17 images · non-contrast
Comparison: CT scan of February 17, 2012.

CLINICAL DATA: Altered mental status.

EXAM:
CT HEAD WITHOUT CONTRAST
TECHNIQUE: Contiguous axial images were obtained from the base of the skull
through the vertex without intravenous contrast.

[Series 4: soft tissue recon · axial · 0.42mm/px · z∈[+205,+324]mm · 13 of 30 slices shown, 17 images]
[im 3/30  brain]
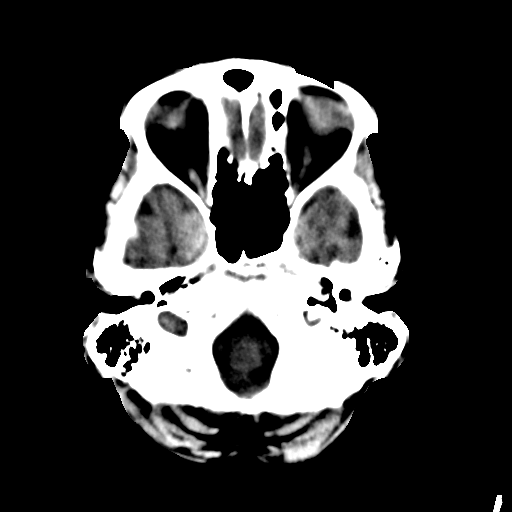
[im 3/30  bone]
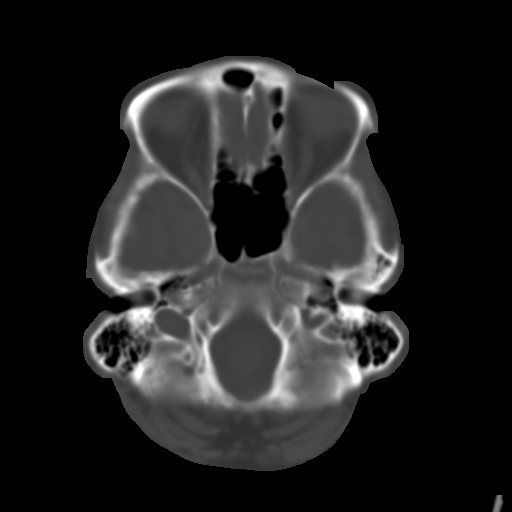
[im 5/30  brain]
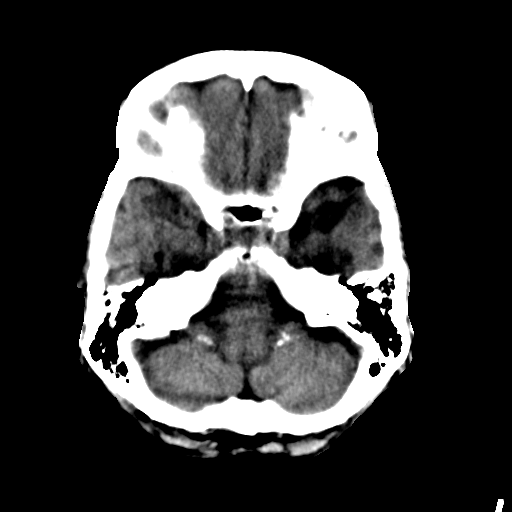
[im 7/30  brain]
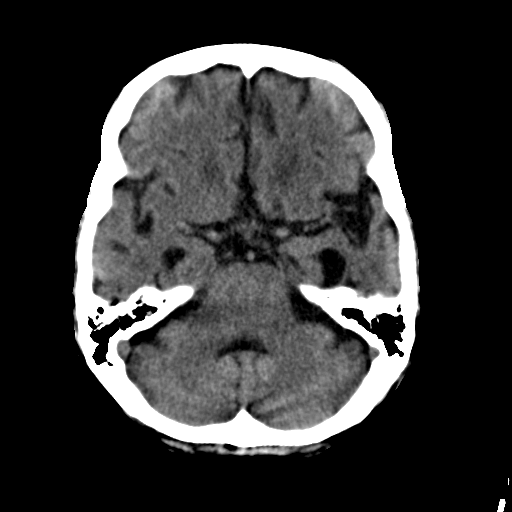
[im 9/30  brain]
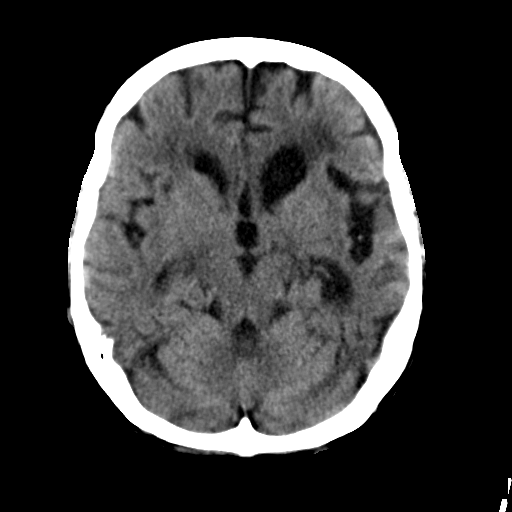
[im 11/30  brain]
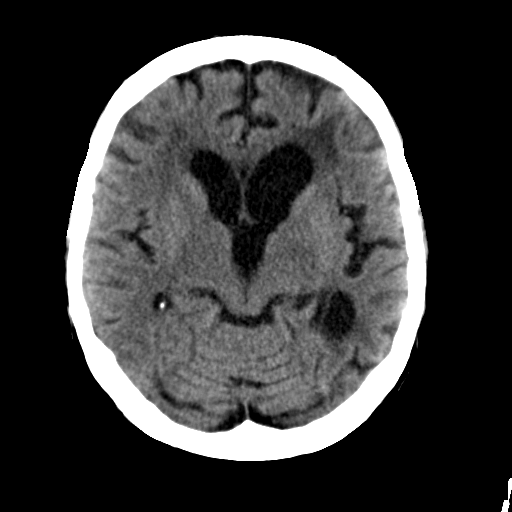
[im 11/30  bone]
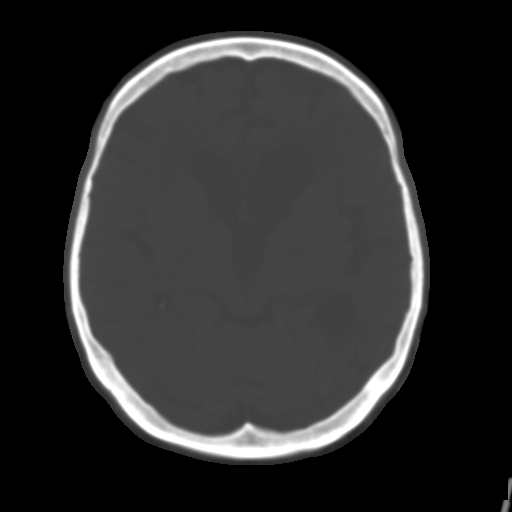
[im 13/30  brain]
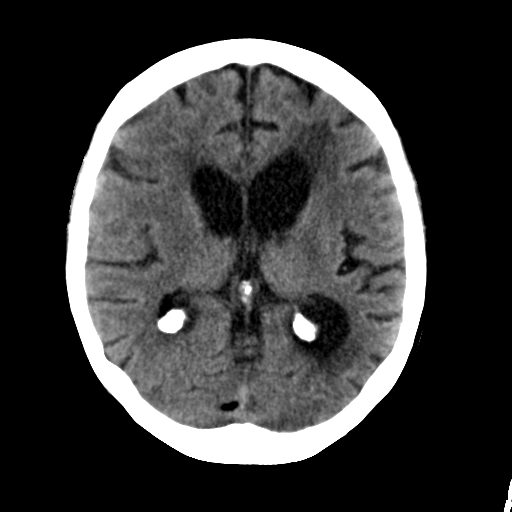
[im 15/30  brain]
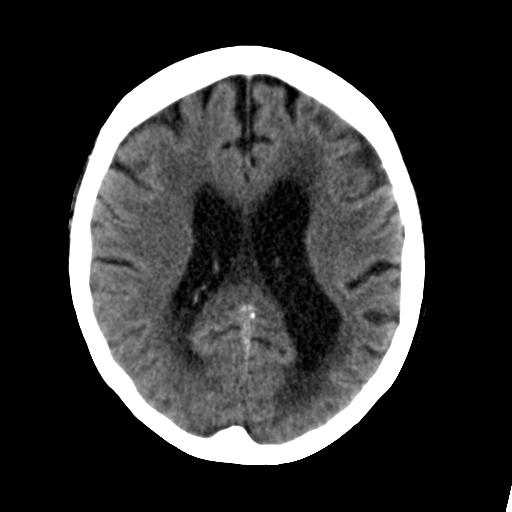
[im 17/30  brain]
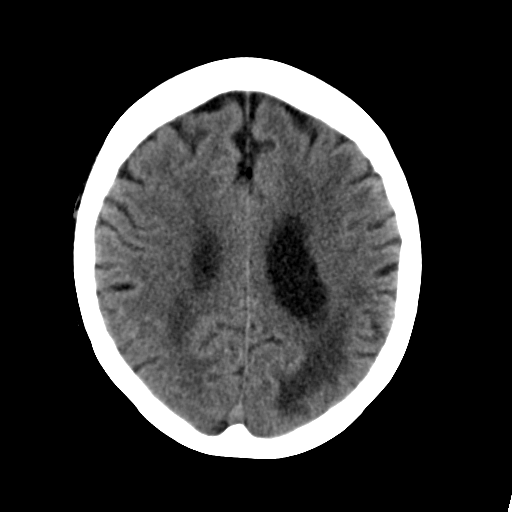
[im 19/30  brain]
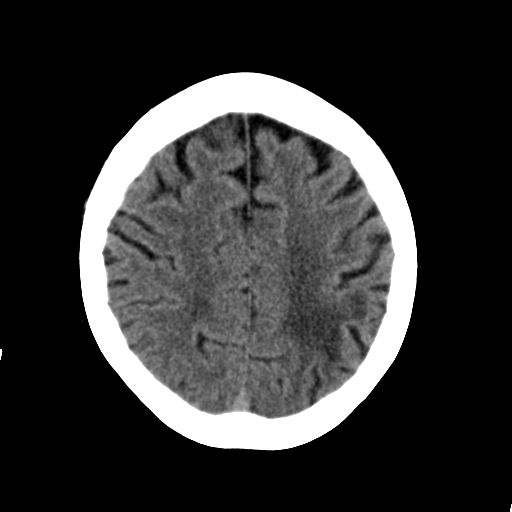
[im 19/30  bone]
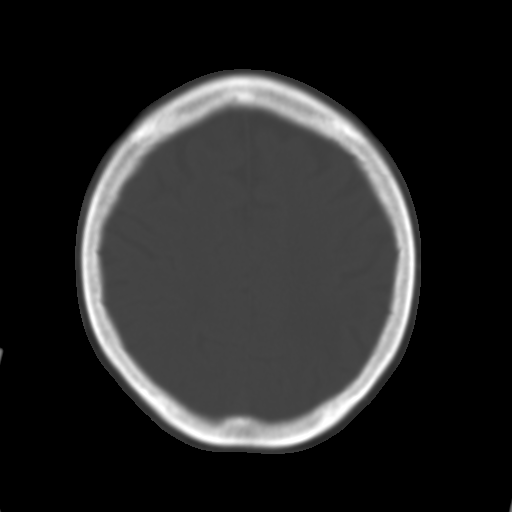
[im 21/30  brain]
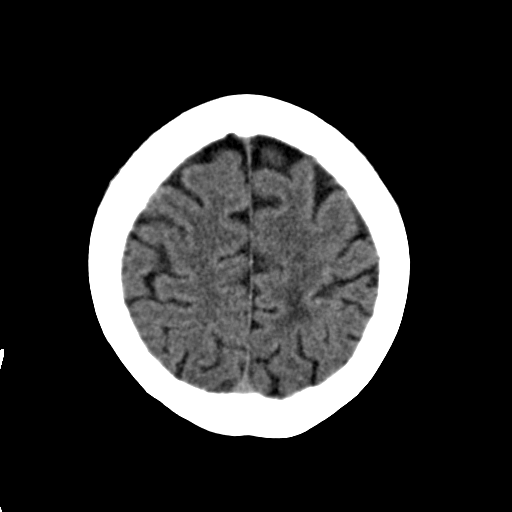
[im 23/30  brain]
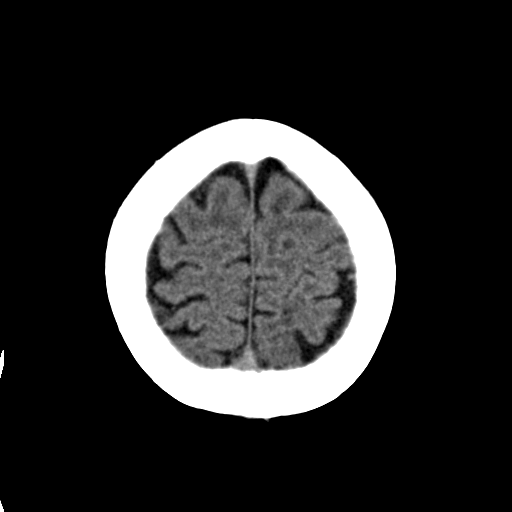
[im 25/30  brain]
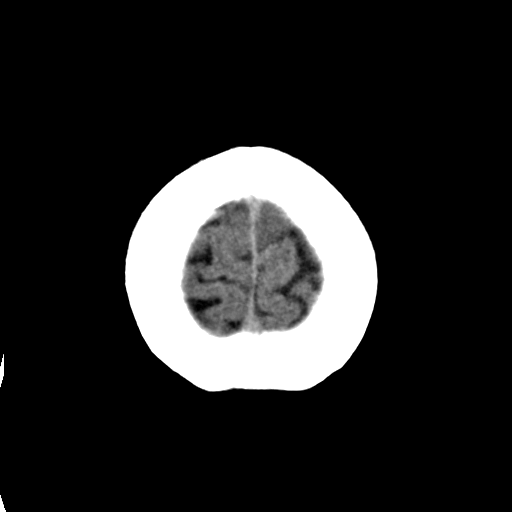
[im 27/30  brain]
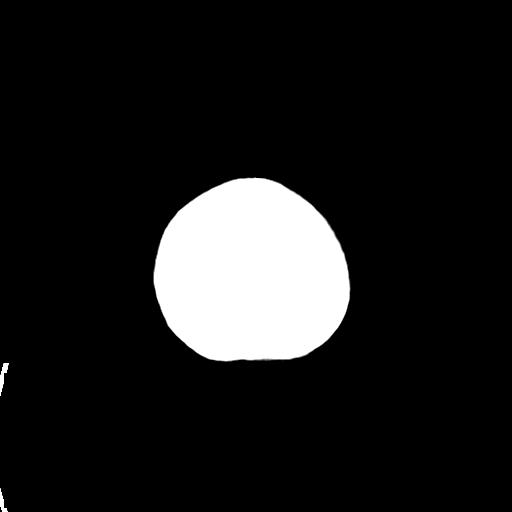
[im 27/30  bone]
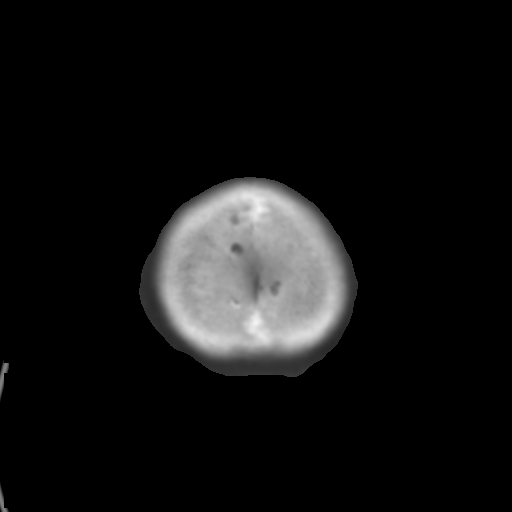

[13 of 30 positions shown; findings below may reference images not displayed]

FINDINGS: Bony calvarium appears intact. Mild diffuse cortical atrophy is
noted. Mild chronic ischemic white matter disease is noted. No mass
effect or midline shift is noted. Ventricular size is within normal
limits. There is no evidence of mass lesion, hemorrhage or acute
infarction.
IMPRESSION: Mild diffuse cortical atrophy. Mild chronic ischemic white matter
disease. No acute intracranial abnormality seen.

## 2018-07-28 IMAGING — DX DG CHEST 1V
1 series · 1 of 1 positions shown · non-contrast
Comparison: 09/13/2015

CLINICAL DATA: Altered mental status.  Unresponsiveness.

EXAM:
CHEST 1 VIEW

[chest ap]
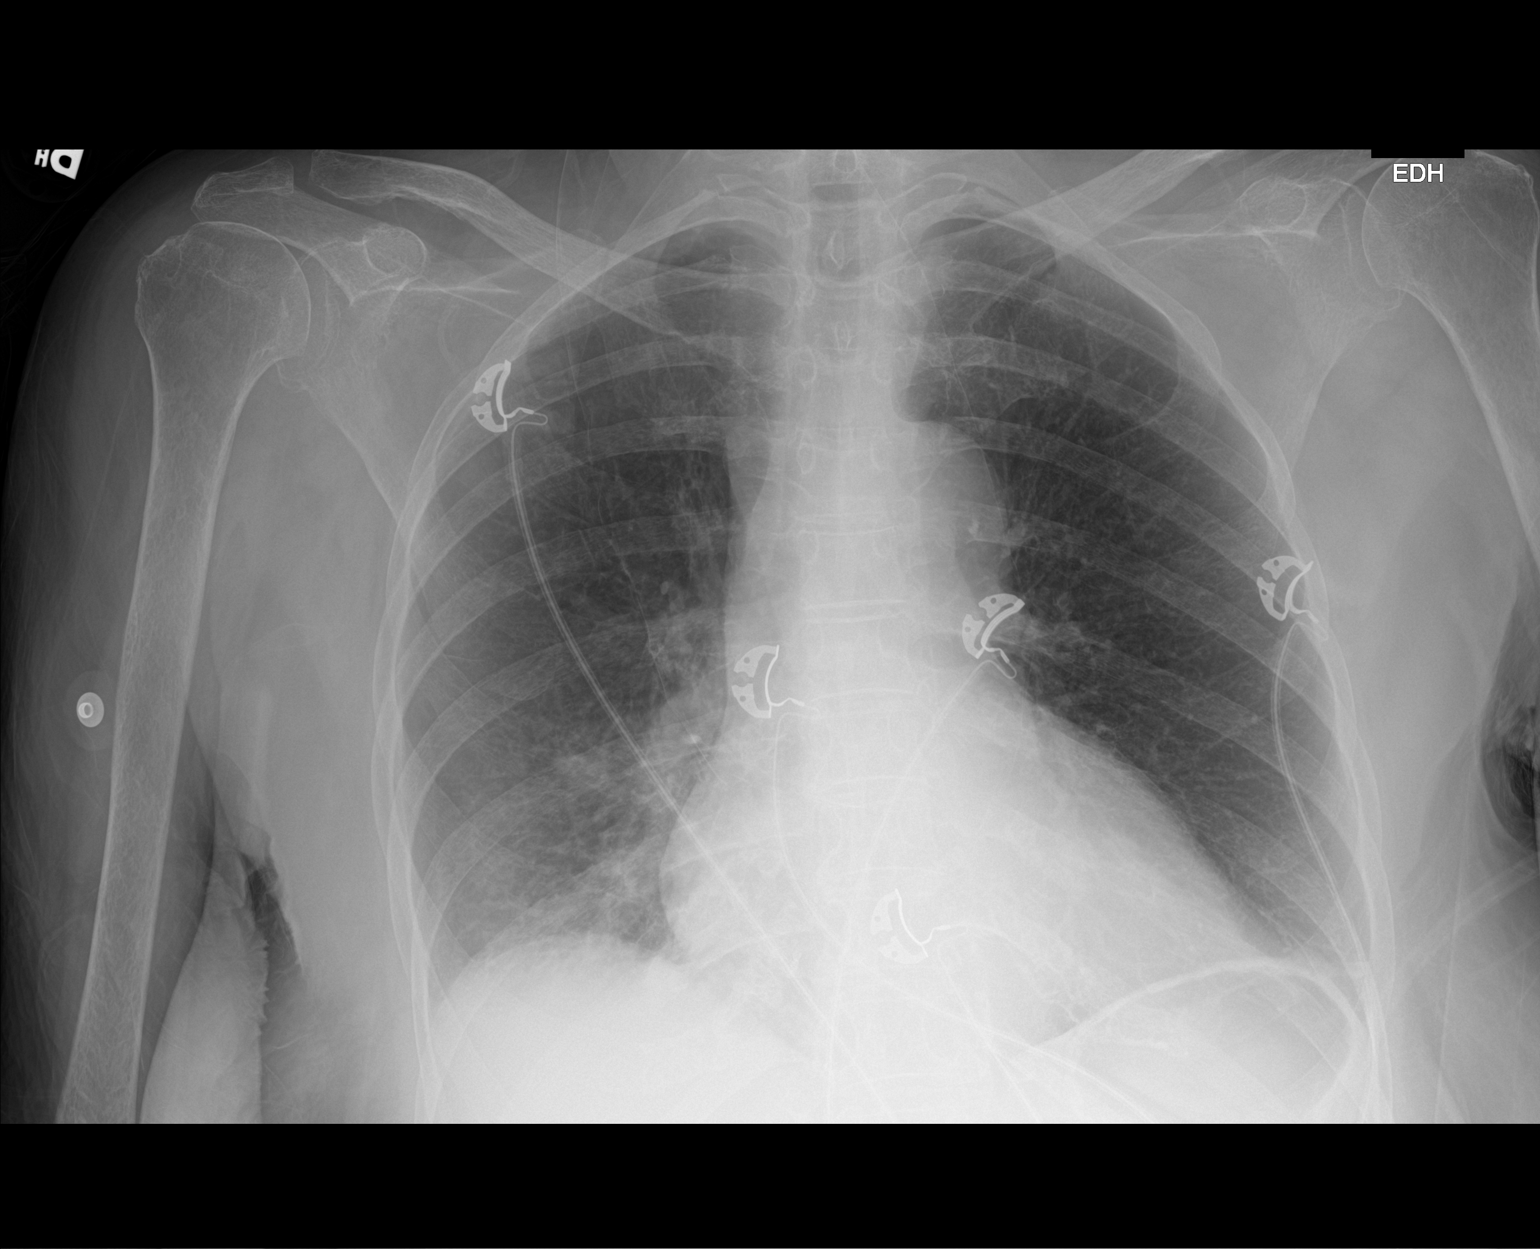

[1 of 1 positions shown; findings below may reference images not displayed]

FINDINGS: Heart size and pulmonary vascularity are normal. Minimal atelectasis
at the lung bases. Lungs are otherwise clear. No appreciable
effusions. No significant bone abnormality.
IMPRESSION: Minimal atelectasis at the lung bases.
